# Patient Record
Sex: Male | Born: 1935 | Race: White | Hispanic: No | Marital: Married
Health system: Southern US, Community
[De-identification: ages and names within clinical notes are randomized; demographics above are authoritative.]

## PROBLEM LIST (undated history)

## (undated) DIAGNOSIS — R200 Anesthesia of skin: Secondary | ICD-10-CM

## (undated) DIAGNOSIS — R202 Paresthesia of skin: Secondary | ICD-10-CM

## (undated) DIAGNOSIS — I10 Essential (primary) hypertension: Secondary | ICD-10-CM

## (undated) DIAGNOSIS — E119 Type 2 diabetes mellitus without complications: Secondary | ICD-10-CM

## (undated) DIAGNOSIS — H9190 Unspecified hearing loss, unspecified ear: Secondary | ICD-10-CM

## (undated) DIAGNOSIS — C4491 Basal cell carcinoma of skin, unspecified: Secondary | ICD-10-CM

## (undated) DIAGNOSIS — R350 Frequency of micturition: Secondary | ICD-10-CM

## (undated) HISTORY — PX: SKIN CANCER EXCISION: SHX779

## (undated) HISTORY — PX: CHOLECYSTECTOMY: SHX55

## (undated) HISTORY — DX: Unspecified hearing loss, unspecified ear: H91.90

## (undated) HISTORY — DX: Anesthesia of skin: R20.0

## (undated) HISTORY — DX: Essential (primary) hypertension: I10

## (undated) HISTORY — DX: Frequency of micturition: R35.0

## (undated) HISTORY — DX: Type 2 diabetes mellitus without complications: E11.9

## (undated) HISTORY — DX: Paresthesia of skin: R20.2

## (undated) HISTORY — PX: SURGERY SCROTAL / TESTICULAR: SUR1316

## (undated) HISTORY — DX: Basal cell carcinoma of skin, unspecified: C44.91

---

## 2012-04-30 ENCOUNTER — Inpatient Hospital Stay: Payer: Self-pay | Admitting: Family Medicine

## 2012-04-30 DIAGNOSIS — I6789 Other cerebrovascular disease: Secondary | ICD-10-CM

## 2012-04-30 LAB — COMPREHENSIVE METABOLIC PANEL
Anion Gap: 6 — ABNORMAL LOW (ref 7–16)
Bilirubin,Total: 0.5 mg/dL (ref 0.2–1.0)
Chloride: 106 mmol/L (ref 98–107)
EGFR (Non-African Amer.): >60
Osmolality: 286 (ref 275–301)
SGPT (ALT): 26 U/L (ref 12–78)

## 2012-04-30 LAB — URINALYSIS, COMPLETE
Bacteria: NONE SEEN
Blood: NEGATIVE
Ketone: NEGATIVE
Nitrite: NEGATIVE
Ph: 6 (ref 4.5–8.0)
RBC,UR: 1 /HPF (ref 0–5)
WBC UR: <1 /HPF (ref 0–5)

## 2012-04-30 LAB — PROTIME-INR: Prothrombin Time: 13 secs (ref 11.5–14.7)

## 2012-04-30 LAB — CBC
HCT: 42.2 % (ref 40.0–52.0)
HGB: 14.2 g/dL (ref 13.0–18.0)
MCHC: 33.6 g/dL (ref 32.0–36.0)
Platelet: 251 10*3/uL (ref 150–440)
RBC: 4.57 10*6/uL (ref 4.40–5.90)
RDW: 12.2 % (ref 11.5–14.5)
WBC: 9 10*3/uL (ref 3.8–10.6)

## 2012-04-30 LAB — TSH: Thyroid Stimulating Horm: 2.76 u[IU]/mL

## 2012-04-30 LAB — TROPONIN I: Troponin-I: <0.02 ng/mL

## 2012-05-01 LAB — CBC WITH DIFFERENTIAL/PLATELET
Basophil %: 0.6 %
HCT: 40.4 % (ref 40.0–52.0)
HGB: 14 g/dL (ref 13.0–18.0)
Lymphocyte #: 2.8 10*3/uL (ref 1.0–3.6)
Neutrophil #: 5.3 10*3/uL (ref 1.4–6.5)
Neutrophil %: 58.3 %
Platelet: 259 10*3/uL (ref 150–440)
RBC: 4.38 10*6/uL — ABNORMAL LOW (ref 4.40–5.90)
WBC: 9.1 10*3/uL (ref 3.8–10.6)

## 2012-05-01 LAB — BASIC METABOLIC PANEL
BUN: 12 mg/dL (ref 7–18)
Calcium, Total: 8.8 mg/dL (ref 8.5–10.1)
Co2: 29 mmol/L (ref 21–32)
Creatinine: 0.8 mg/dL (ref 0.60–1.30)
Glucose: 178 mg/dL — ABNORMAL HIGH (ref 65–99)
Osmolality: 285 (ref 275–301)

## 2012-05-01 LAB — LIPID PANEL
Cholesterol: 139 mg/dL (ref 0–200)
HDL Cholesterol: 32 mg/dL — ABNORMAL LOW (ref 40–60)
Triglycerides: 270 mg/dL — ABNORMAL HIGH (ref 0–200)

## 2012-05-02 ENCOUNTER — Other Ambulatory Visit: Payer: Self-pay | Admitting: Physical Medicine and Rehabilitation

## 2012-05-02 ENCOUNTER — Encounter: Payer: Self-pay | Admitting: *Deleted

## 2012-05-02 ENCOUNTER — Encounter: Payer: Self-pay | Admitting: Physical Medicine and Rehabilitation

## 2012-05-02 ENCOUNTER — Inpatient Hospital Stay (HOSPITAL_COMMUNITY)
Admission: RE | Admit: 2012-05-02 | Discharge: 2012-05-15 | DRG: 945 | Disposition: A | Discharge status: Home / Self Care | Payer: Medicare Other | Source: Intra-hospital | Attending: Physical Medicine & Rehabilitation | Admitting: Physical Medicine & Rehabilitation

## 2012-05-02 DIAGNOSIS — G319 Degenerative disease of nervous system, unspecified: Secondary | ICD-10-CM

## 2012-05-02 DIAGNOSIS — R1310 Dysphagia, unspecified: Secondary | ICD-10-CM

## 2012-05-02 DIAGNOSIS — R29810 Facial weakness: Secondary | ICD-10-CM

## 2012-05-02 DIAGNOSIS — Z5189 Encounter for other specified aftercare: Principal | ICD-10-CM

## 2012-05-02 DIAGNOSIS — I10 Essential (primary) hypertension: Secondary | ICD-10-CM

## 2012-05-02 DIAGNOSIS — M19049 Primary osteoarthritis, unspecified hand: Secondary | ICD-10-CM

## 2012-05-02 DIAGNOSIS — I639 Cerebral infarction, unspecified: Secondary | ICD-10-CM | POA: Diagnosis present

## 2012-05-02 DIAGNOSIS — H918X9 Other specified hearing loss, unspecified ear: Secondary | ICD-10-CM

## 2012-05-02 DIAGNOSIS — G5600 Carpal tunnel syndrome, unspecified upper limb: Secondary | ICD-10-CM

## 2012-05-02 DIAGNOSIS — G8190 Hemiplegia, unspecified affecting unspecified side: Secondary | ICD-10-CM

## 2012-05-02 DIAGNOSIS — R4789 Other speech disturbances: Secondary | ICD-10-CM

## 2012-05-02 DIAGNOSIS — E119 Type 2 diabetes mellitus without complications: Secondary | ICD-10-CM

## 2012-05-02 DIAGNOSIS — H9190 Unspecified hearing loss, unspecified ear: Secondary | ICD-10-CM | POA: Insufficient documentation

## 2012-05-02 DIAGNOSIS — I6330 Cerebral infarction due to thrombosis of unspecified cerebral artery: Secondary | ICD-10-CM

## 2012-05-02 DIAGNOSIS — G8110 Spastic hemiplegia affecting unspecified side: Secondary | ICD-10-CM

## 2012-05-02 DIAGNOSIS — Z85820 Personal history of malignant melanoma of skin: Secondary | ICD-10-CM

## 2012-05-02 DIAGNOSIS — I6350 Cerebral infarction due to unspecified occlusion or stenosis of unspecified cerebral artery: Secondary | ICD-10-CM

## 2012-05-02 DIAGNOSIS — I69921 Dysphasia following unspecified cerebrovascular disease: Secondary | ICD-10-CM

## 2012-05-02 LAB — BASIC METABOLIC PANEL
Anion Gap: 6 — ABNORMAL LOW (ref 7–16)
BUN: 15 mg/dL (ref 7–18)
Calcium, Total: 8.5 mg/dL (ref 8.5–10.1)
Chloride: 106 mmol/L (ref 98–107)
Co2: 28 mmol/L (ref 21–32)
Creatinine: 0.91 mg/dL (ref 0.60–1.30)
Glucose: 173 mg/dL — ABNORMAL HIGH (ref 65–99)
Osmolality: 284 (ref 275–301)

## 2012-05-02 LAB — URINALYSIS, ROUTINE W REFLEX MICROSCOPIC
Bilirubin Urine: NEGATIVE
Glucose, UA: NEGATIVE mg/dL
Hgb urine dipstick: NEGATIVE
Ketones, ur: 15 mg/dL — AB
Leukocytes, UA: NEGATIVE
Protein, ur: NEGATIVE mg/dL

## 2012-05-02 LAB — GLUCOSE, CAPILLARY
Glucose-Capillary: 155 mg/dL — ABNORMAL HIGH (ref 70–99)
Glucose-Capillary: 179 mg/dL — ABNORMAL HIGH (ref 70–99)

## 2012-05-02 LAB — MAGNESIUM: Magnesium: 1.6 mg/dL — ABNORMAL LOW

## 2012-05-02 MED ORDER — INSULIN ASPART 100 UNIT/ML ~~LOC~~ SOLN
0.0000 [IU] | Freq: Every day | SUBCUTANEOUS | Status: DC
  Administered 2012-05-03 – 2012-05-04 (×2): 2 [IU] via SUBCUTANEOUS
  Administered 2012-05-05: 22:00:00 via SUBCUTANEOUS
  Administered 2012-05-07: 2 [IU] via SUBCUTANEOUS

## 2012-05-02 MED ORDER — ALUM & MAG HYDROXIDE-SIMETH 200-200-20 MG/5ML PO SUSP: 30.0000 mL | ORAL | Status: DC | PRN

## 2012-05-02 MED ORDER — METHOCARBAMOL 500 MG PO TABS
500.0000 mg | ORAL_TABLET | Freq: Four times a day (QID) | ORAL | Status: DC | PRN
  Administered 2012-05-02: 500 mg via ORAL
  Filled 2012-05-02: qty 1

## 2012-05-02 MED ORDER — METFORMIN HCL 500 MG PO TABS
500.0000 mg | ORAL_TABLET | Freq: Two times a day (BID) | ORAL | Status: DC
  Administered 2012-05-02 – 2012-05-08 (×12): 500 mg via ORAL
  Filled 2012-05-02 (×14): qty 1

## 2012-05-02 MED ORDER — INSULIN GLARGINE 100 UNIT/ML ~~LOC~~ SOLN
10.0000 [IU] | Freq: Every day | SUBCUTANEOUS | Status: DC
  Filled 2012-05-02: qty 0.1

## 2012-05-02 MED ORDER — TRAZODONE HCL 50 MG PO TABS: 25.0000 mg | ORAL_TABLET | Freq: Every evening | ORAL | Status: DC | PRN

## 2012-05-02 MED ORDER — ASPIRIN EC 325 MG PO TBEC
325.0000 mg | DELAYED_RELEASE_TABLET | Freq: Every day | ORAL | Status: DC
  Administered 2012-05-03 – 2012-05-15 (×13): 325 mg via ORAL
  Filled 2012-05-02 (×14): qty 1

## 2012-05-02 MED ORDER — ACETAMINOPHEN 325 MG PO TABS
325.0000 mg | ORAL_TABLET | ORAL | Status: DC | PRN
  Administered 2012-05-06: 650 mg via ORAL
  Filled 2012-05-02: qty 2

## 2012-05-02 MED ORDER — CALCIUM POLYCARBOPHIL 625 MG PO TABS
625.0000 mg | ORAL_TABLET | Freq: Every day | ORAL | Status: DC
  Administered 2012-05-03 – 2012-05-15 (×11): 625 mg via ORAL
  Filled 2012-05-02 (×14): qty 1

## 2012-05-02 MED ORDER — INSULIN ASPART 100 UNIT/ML ~~LOC~~ SOLN
0.0000 [IU] | Freq: Three times a day (TID) | SUBCUTANEOUS | Status: DC
  Administered 2012-05-02: 3 [IU] via SUBCUTANEOUS
  Administered 2012-05-03: 8 [IU] via SUBCUTANEOUS
  Administered 2012-05-03 (×2): 2 [IU] via SUBCUTANEOUS
  Administered 2012-05-04: 8 [IU] via SUBCUTANEOUS
  Administered 2012-05-04: 5 [IU] via SUBCUTANEOUS
  Administered 2012-05-04 – 2012-05-05 (×3): 3 [IU] via SUBCUTANEOUS
  Administered 2012-05-05 – 2012-05-06 (×2): 5 [IU] via SUBCUTANEOUS
  Administered 2012-05-06 – 2012-05-07 (×3): 3 [IU] via SUBCUTANEOUS
  Administered 2012-05-07: 5 [IU] via SUBCUTANEOUS
  Administered 2012-05-07 – 2012-05-08 (×3): 3 [IU] via SUBCUTANEOUS
  Administered 2012-05-08: 8 [IU] via SUBCUTANEOUS
  Administered 2012-05-09: 3 [IU] via SUBCUTANEOUS
  Administered 2012-05-10: 2 [IU] via SUBCUTANEOUS

## 2012-05-02 MED ORDER — ENOXAPARIN SODIUM 40 MG/0.4ML ~~LOC~~ SOLN
40.0000 mg | SUBCUTANEOUS | Status: DC
  Administered 2012-05-02 – 2012-05-14 (×13): 40 mg via SUBCUTANEOUS
  Filled 2012-05-02 (×14): qty 0.4

## 2012-05-02 MED ORDER — BISACODYL 5 MG PO TBEC
5.0000 mg | DELAYED_RELEASE_TABLET | Freq: Every day | ORAL | Status: DC | PRN
  Administered 2012-05-13: 5 mg via ORAL
  Filled 2012-05-02: qty 1

## 2012-05-02 MED ORDER — DIPHENHYDRAMINE HCL 12.5 MG/5ML PO ELIX
12.5000 mg | ORAL_SOLUTION | Freq: Four times a day (QID) | ORAL | Status: DC | PRN
  Filled 2012-05-02: qty 10

## 2012-05-02 MED ORDER — NIACIN ER 500 MG PO CPCR
500.0000 mg | ORAL_CAPSULE | Freq: Every day | ORAL | Status: DC
  Administered 2012-05-02 – 2012-05-14 (×13): 500 mg via ORAL
  Filled 2012-05-02 (×14): qty 1

## 2012-05-02 MED ORDER — INSULIN GLARGINE 100 UNIT/ML ~~LOC~~ SOLN
30.0000 [IU] | Freq: Two times a day (BID) | SUBCUTANEOUS | Status: DC
  Administered 2012-05-02: 30 [IU] via SUBCUTANEOUS
  Filled 2012-05-02 (×4): qty 0.3

## 2012-05-02 MED ORDER — RANITIDINE HCL 150 MG/10ML PO SYRP
150.0000 mg | ORAL_SOLUTION | Freq: Two times a day (BID) | ORAL | Status: DC
  Administered 2012-05-02 – 2012-05-04 (×4): 150 mg via ORAL
  Filled 2012-05-02 (×6): qty 10

## 2012-05-02 MED ORDER — GUAIFENESIN-DM 100-10 MG/5ML PO SYRP: 5.0000 mL | ORAL_SOLUTION | Freq: Four times a day (QID) | ORAL | Status: DC | PRN

## 2012-05-02 MED ORDER — ATORVASTATIN CALCIUM 20 MG PO TABS
20.0000 mg | ORAL_TABLET | Freq: Every day | ORAL | Status: DC
  Administered 2012-05-02 – 2012-05-14 (×13): 20 mg via ORAL
  Filled 2012-05-02 (×14): qty 1

## 2012-05-02 MED ORDER — SENNOSIDES-DOCUSATE SODIUM 8.6-50 MG PO TABS: 1.0000 | ORAL_TABLET | Freq: Every evening | ORAL | Status: DC | PRN

## 2012-05-02 NOTE — Progress Notes (Signed)
Patient information reviewed and entered into eRehab system by Lavonn Maxcy, RN, CRRN, PPS Coordinator.  Information including medical coding and functional independence measure will be reviewed and updated through discharge.     Per nursing patient was given "Data Collection Information Summary for Patients in Inpatient Rehabilitation Facilities with attached "Privacy Act Statement-Health Care Records" upon admission.  

## 2012-05-02 NOTE — Progress Notes (Signed)
Pt arrived via EMS from [place] to room 4038, pt alert and orienetd, wife present wit pt, pt oriented to rehab schedule, room, safety plan and call bell system. Pt resting in bed with call bell in reach, pt denies any concerns

## 2012-05-02 NOTE — Plan of Care (Addendum)
Overall Plan of Care [facility]) Patient Details Name: Larry Williams MRN: [id number] DOB: 1935/03/27  Diagnosis:  Left pontine infarct  Co-morbidities: Hypertension  .  Skin cancer, basal cell  .  Hearing loss  .  Numbness and tingling in right hand  .  Frequent urination  .  Diabetes mellitus without complication    Functional Problem List  Patient demonstrates impairments in the following areas: Balance, Bladder, Bowel, Endurance, Medication Management, Motor, Safety, Sensory  and Skin Integrity  Basic ADL's: eating, grooming, bathing, dressing and toileting Advanced ADL's: simple meal preparation  Transfers:  bed mobility, bed to chair, toilet, tub/shower, car, furniture and floor Locomotion:  ambulation, wheelchair mobility and stairs  Additional Impairments:  Swallowing and Communication  expression  Anticipated Outcomes Item Anticipated Outcome  Eating/Swallowing  Mod I   Basic self-care  Mod I  Tolieting  Mod I  Bowel/Bladder  Pt is continent of bowel and bladder with episodes of bowel incontinence  Transfers  Mod I   Locomotion  Supervision with RW   Communication  Mod I   Cognition  At baseline  Pain  Pt c/o back discomfort from being in bed  Safety/Judgment  Pt will  Call for assist with min assist  Other     Therapy Plan: PT Intensity: Minimum of 1-2 x/day ,45 to 90 minutes PT Frequency: 5 out of 7 days PT Duration Estimated Length of Stay: 10-14days OT Intensity: Minimum of 1-2 x/day, 45 to 90 minutes OT Frequency: 5 out of 7 days OT Duration/Estimated Length of Stay: 10-14 days SLP Intensity: Minumum of 1-2 x/day, 30 to 90 minutes SLP Frequency: 5 out of 7 days SLP Duration/Estimated Length of Stay: 1-2 weeks    Team Interventions: Item RN PT OT SLP SW TR Other  Self Care/Advanced ADL Retraining  x x      Neuromuscular Re-Education  x x      Therapeutic Activities  x x x     UE/LE Strength Training/ROM  x x      UE/LE Coordination  Activities  x x      Visual/Perceptual Remediation/Compensation         DME/Adaptive Equipment Instruction  x x      Therapeutic Exercise  x x      Balance/Vestibular Training  x x      Patient/Family Education  x x x     Cognitive Remediation/Compensation         Functional Mobility Training  x x      Ambulation/Gait Training  x       Museum/gallery curator  x       Wheelchair Propulsion/Positioning  x       Functional Tourist information centre manager Reintegration  x x      Dysphagia/Aspiration Printmaker    x     Speech/Language Facilitation    x     Bladder Management x        Bowel Management x        Disease Management/Prevention  x       Pain Management x x x      Medication Management x        Skin Care/Wound Management         Splinting/Orthotics         Discharge Planning  x x x     Psychosocial Support x x x  Team Discharge Planning: Destination: PT- Home, OT- Home , SLP-Home Projected Follow-up: PT-Home health PT;24 hour supervision/assistance Home vs Outpatient PT, OT-  Outpatient OT, SLP- (TBD) Projected Equipment Needs: PT-Rolling walker with 5" wheels;Cane, OT- Tub/shower seat (TBD), SLP-None recommended by SLP Patient/family involved in discharge planning: PT-  Patient,  OT-Patient, SLP-Patient  MD ELOS: 2 weeks Medical Rehab Prognosis:  Excellent Assessment: 77 year old male poorly compliant diabetic admitted for right-sided weakness. Workup revealed left pontine infarct and is now requiring 24 7 rehabilitation RN and M.D. As well as CIR level PT OT and speech therapy. Treatment team will focus on ADLs mobility speech intelligibility and spasticity. Goals for treatment, modified independent ADLs and mobility except supervision for higher level such as steps.    See Team Conference Notes for weekly updates to the plan of care

## 2012-05-02 NOTE — H&P (Signed)
CC: Slurred speech, RUE weakness X one month  HPI: Mr. Larry Williams is a 77 year old LH-male with history of poorly controlled DM, HTN, right hand weakness X 1 month; who was admitted to [facility] on 04/30/12 with slurred speech and right facial weakness. 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 Carotid dopplers done without ICA stenosis. 2D echo with EF 55-60% without wall abnormality. He was evaluated by Dr. Dwan Bolt (neuro) who recommended ASA as patient non complaint with this prior to admission. BSS done by ST and D3, nectar liquids recommended with strict aspiration precautions. Therapy evaluations done and patient limited by right sided weakness with decreased coordination and poor safety with walker.   Patient was planning to return to [place] this week  Review of Systems  Constitutional:       RUE x 1 mo  HENT: Positive for hearing loss.   Musculoskeletal: Positive for falls.       Fell at work several mo ago  Neurological: Positive for dizziness, speech change, focal weakness and weakness.       Couldn't maintain balance with eyes closed for 1 year  All other systems reviewed and are negative.    Past Medical History   Diagnosis  Date   .  Hypertension    .  Skin cancer, basal cell    .  Hearing loss    .  Numbness and tingling in right hand    .  Frequent urination    .  Diabetes mellitus without complication      poorly controlled-BS occasionally 300-400 range.    Past Surgical History   Procedure  Laterality  Date   .  Cholecystectomy     .  Surgery scrotal / testicular       for undescended testicle at age 56   .  Skin cancer excision      Family History   Problem  Relation  Age of Onset   .  Cancer - Lung  Father     Social History: Married. 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 has no tobacco, alcohol, and drug  history on file.  Allergies: Allergies no known allergies  Home:   Functional History:  Independent without AD.  Functional Status:  Mobility:  Contact guard assist for transfer with impaired balance.  Min assist ambulating 69' with RW. Needs cues for safe walker use.   ADL:   Cognition:    Physical Exam:  General: No acute distress HEENT: No evidence of facial droop. Nares clear, tongue midline, no oral lesions Heart: Regular rate and rhythm no rubs murmurs or extra sounds Lungs clear to auscultation Abdomen: Positive bowel sounds soft nontender palpation Extremities: No clubbing cyanosis or edema Skin: No evidence of skin breakdown on pressure areas Neuro: Cranial nerves II through XII are intact Speech is dysarthric No evidence of aphasia No evidence of apraxia Mild right upper extremity ataxia finger nose to finger testing Motor strength is 4 minus at the right deltoid, biceps, triceps, grip Right hand finger to thumb opposition is slowed with decreased fine motor movements Right lower extremity for/5 in the hip flexor knee extensor 3 minus/5 in the right ankle dorsiflexor and plantar flexor Sensation intact to light touch and proprioception in both upper and lower limbs Left-sided strength is normal  No results found for this or any previous visit (from  the past 48 hour(s)).  No results found.  Post Admission Physician Evaluation:  1. Functional deficits secondary to Left pontine infarct with Right hemiparesis and dysphagia. 2. Patient is admitted to receive collaborative, interdisciplinary care between the physiatrist, rehab nursing staff, and therapy team. 3. Patient's level of medical complexity and substantial therapy needs in context of that medical necessity cannot be provided at a lesser intensity of care such as a SNF. 4. Patient has experienced substantial functional loss from his/her baseline which was documented above under the "Functional History" and  "Functional Status" headings. Judging by the patient's diagnosis, physical exam, and functional history, the patient has potential for functional progress which will result in measurable gains while on inpatient rehab. These gains will be of substantial and practical use upon discharge in facilitating mobility and self-care at the household level. 5. Physiatrist will provide 24 hour management of medical needs as well as oversight of the therapy plan/treatment and provide guidance as appropriate regarding the interaction of the two. 6. 24 hour rehab nursing will assist with bladder management, bowel management, safety, skin/wound care, disease management, medication administration and patient education and help integrate therapy concepts, techniques,education, etc. 7. PT will assess and treat for/with: Pre-gait training, gait training, neuromuscular reeducation, safety, endurance, equipment. Goals are: Modified independent level with all mobility. 8. OT will assess and treat for/with: ADLs, cognitive perceptual skills, neuromuscular reeducation, fine motor tasks, safety, equipment. Goals are: Modified independent level with ADLs. 9. SLP will assess and treat for/with: Swallowing, dysarthria. Goals are: 100% speech intelligibility, safe by mouth intake of all consistencies. 10. Case Management and Social Worker will assess and treat for psychological issues and discharge planning. 11. Team conference will be held weekly to assess progress toward goals and to determine barriers to discharge. 12. Patient will receive at least 3 hours of therapy per day at least 5 days per week. 13. ELOS: 10-12 days Prognosis: excellent Medical Problem List and Plan:  1. DVT Prophylaxis/Anticoagulation: Pharmaceutical: Lovenox  2. Pain Management: Tylenol as needed  3. Mood: No evidence of depression or emotional lability. Will monitor.  4. Neuropsych: This patient Is capable of making decisions on his/her own behalf.  5.  Diabetes continue Lantus as well as sliding scale will adjust as needed. 6. Hypertension by history currently not on medications will monitor. 05/02/2012

## 2012-05-02 NOTE — PMR Pre-admission (Signed)
[facility] PMR Admission Coordinator Pre-Admission Assessment  Patient: Larry Williams is an 77 y.o., male MRN: [id number] DOB: 1935-09-08 Height: 5\' 8"  (172.7 cm) Weight: 99.791 kg (220 lb)  Insurance Information  PRIMARY:  Primary: Medicare Policy#:[id number] A Subscriber: Patient Benefits: [employer] Effective Date: 06/18/02 Deductible: $1126 OOP Max: None Life Max: Unlimited CIR: 100% SNF: 100 days LBD:  Outpatient: 80% Copay: 20%  Home Health: 100% DME: 80%  Copay: 20% Providers: Patient's choice  SECONDARY: [employer]     Policy#: [id number]     Subscriber: self   Emergency Contact Information Contact Information   Hollister Wessler   [phone]      Current Medical History  Patient Admitting Diagnosis: Acute pontine ischemia  History of Present Illness: 77 yo male with history of uncontrolled diabetes and hypertension. He woke up on 04/30/12 with slurred speech and difficulty walking. CT indicated no acute intracranial findings. MRI shows acute pontine acute ischemia and severe atrophy chronic white matter ischemic change. Patient is alert and oriented x3 and persists with right sided weakness, dysarthria of speech and dysphagia. NIH Stroke scale:  Past Medical History  Uncontrolled type 2 insulin dependent diabetes Hypertension History of skin cancers/ basal cell carcinoma Hearing loss  Family History  family history is not on file.  Prior Rehab/Hospitalizations: none   Current Medications See MAR  Patients Current Diet:  Dysphagia 3, nectar thick liquids  Precautions / Restrictions Precautions Precautions/Special Needs: Swallowing   Prior Activity Level Community (5-7x/wk): Active daily Home Assistive Devices / Equipment     Prior Functional Level Current Functional Level  Bed Mobility  Independent  Min assist   Transfers  Independent  Min assist   Mobility - Walk/Wheelchair  Independent  Min assist   Upper Body Dressing  Independent  Min assist   Lower Body Dressing  Independent  Min assist   Grooming  Independent  Min assist   Eating/Drinking  Independent  Mod Independent   Toilet Transfer  Independent  Min assist   Bladder Continence   Independent  Continent, uses urinal   Bowel Management  Independent  Incontinent x1 due to urgency   Stair Climbing  Able to Take Stairs?: Yes      Communication  WNL  WNL   Memory  WNL  WNL   Cooking/Meal Prep  Independent      Housework  Independent    Money Management  independent    Driving  Yes     Previous Home Environment Living Arrangements: Spouse/significant other Lives With: Spouse Available Help at Discharge: Family Type of Home: Mobile home Home Layout: One level Home Access: Stairs to enter Entrance Stairs-Rails: Can reach both Entrance Stairs-Number of Steps: 3-4 Bathroom Shower/Tub: Health visitor: Pharmacist, community: No Home Care Services: No  Discharge Living Setting Plans for Discharge Living Setting: Patient's home;Mobile Home Type of Home at Discharge: Mobile home Discharge Home Layout: One level Discharge Home Access: Stairs to enter Entrance Stairs-Rails: Can reach both Entrance Stairs-Number of Steps: 3-4 Discharge Bathroom Shower/Tub: Walk-in shower Discharge Bathroom Toilet: Standard Discharge Bathroom Accessibility: No Do you have any problems obtaining your medications?: No  Social/Family/Support Systems Patient Roles: Spouse;Volunteer Programmer, 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[employer].) Contact Information: [phone] Anticipated Caregiver: Wife Anticipated Caregiver's Contact Information: Geno Sydnor: [phone] Ability/Limitations of Caregiver: min assist Caregiver Availability: 24/7 Discharge Plan Discussed with Primary Caregiver: Yes Is Caregiver In Agreement with Plan?: Yes Does Caregiver/Family have Issues with Lodging/Transportation while Pt is in Rehab?:  No  Goals/Additional  Needs Patient/Family Goal for Rehab: modified independent overall Expected length of stay: 1 week Cultural Considerations: none Dietary Needs: Dysphagia  3 with nectar-thick liquids Equipment Needs: TBD Additional Information: 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 They are from [place] and live in a camper-trailer there. The trailor is not accessible, difficult to enter. They plan to stay in [facility] until patient is ready to return to [place]. They have family in [place]. Pt/Family Agrees to Admission and willing to participate: Yes Program Orientation Provided & Reviewed with Pt/Caregiver Including Roles  & Responsibilities: Yes  Patient Condition: This patient was evaluated by the CIR Admission Coordinator on 05/01/12 and was determined to be appropriate for Acute Inpatient Rehabilitation. The patient's case has been discussed with the Rehabilitation Physician who has determined and documented that the patient's condition is appropriate for intensive rehabilitative care in an inpatient rehabilitation facility. Will admit to inpatient rehab today.  Preadmission Screen Completed By:  Meryl Dare, 05/02/2012 10:33 AM ______________________________________________________________________   Discussed status with Dr. Wynn Banker on 05/02/12 at 0815 and received telephone approval for admission today.  Admission Coordinator:  Meryl Dare, time 1115/Date 05/02/12   Assessment/Plan: Diagnosis: 1. Does the need for close, 24 hr/day  Medical supervision in concert with the patient's rehab needs make it unreasonable for this patient to be served in a less intensive setting? Yes 2. Co-Morbidities requiring supervision/potential complications: Diabetes, HTN 3. Due to bladder management, bowel management, safety, skin/wound care, disease management, medication administration and patient education, does the  patient require 24 hr/day rehab nursing? Yes 4. Does the patient require coordinated care of a physician, rehab nurse, PT (1-2 hrs/day, 5 days/week), OT (1-2 hrs/day, 5 days/week) and SLP (.5-1 hrs/day, 5 days/week) to address physical and functional deficits in the context of the above medical diagnosis(es)? Yes Addressing deficits in the following areas: balance, endurance, locomotion, strength, transferring, bowel/bladder control, bathing, dressing, feeding, grooming, toileting and swallowing 5. Can the patient actively participate in an intensive therapy program of at least 3 hrs of therapy 5 days a week? Yes 6. The potential for patient to make measurable gains while on inpatient rehab is good 7. Anticipated functional outcomes upon discharge from inpatients are Sup/Mod I mob PT, Sup/Mod I ADL OT, Safe po intake nl consistencySLP 8. Estimated rehab length of stay to reach the above functional goals is: 7-10 d 9. Does the patient have adequate social supports to accommodate these discharge functional goals? Yes 10. Anticipated D/C setting: Home 11. Anticipated post D/C treatments: Outpt therapy 12. Overall Rehab/Functional Prognosis: good    RECOMMENDATIONS: This patient's condition is appropriate for continued rehabilitative care in the following setting: CIR Patient has agreed to participate in recommended program. Potentially Note that insurance prior authorization may be required for reimbursement for recommended care.  Comment:  Meryl Dare 05/02/2012

## 2012-05-03 ENCOUNTER — Inpatient Hospital Stay (HOSPITAL_COMMUNITY): Payer: Medicare Other | Admitting: Speech Pathology

## 2012-05-03 ENCOUNTER — Inpatient Hospital Stay (HOSPITAL_COMMUNITY): Payer: Medicare Other | Admitting: *Deleted

## 2012-05-03 ENCOUNTER — Inpatient Hospital Stay (HOSPITAL_COMMUNITY): Payer: Medicare Other | Admitting: Occupational Therapy

## 2012-05-03 DIAGNOSIS — I69921 Dysphasia following unspecified cerebrovascular disease: Secondary | ICD-10-CM

## 2012-05-03 DIAGNOSIS — G8110 Spastic hemiplegia affecting unspecified side: Secondary | ICD-10-CM

## 2012-05-03 DIAGNOSIS — I6330 Cerebral infarction due to thrombosis of unspecified cerebral artery: Secondary | ICD-10-CM

## 2012-05-03 LAB — CBC WITH DIFFERENTIAL/PLATELET
Basophils Absolute: 0 10*3/uL (ref 0.0–0.1)
Basophils Relative: 0 % (ref 0–1)
Lymphocytes Relative: 19 % (ref 12–46)
MCHC: 35.7 g/dL (ref 30.0–36.0)
Neutro Abs: 9.1 10*3/uL — ABNORMAL HIGH (ref 1.7–7.7)
Neutrophils Relative %: 74 % (ref 43–77)
Platelets: 250 10*3/uL (ref 150–400)
RDW: 12 % (ref 11.5–15.5)
WBC: 12.3 10*3/uL — ABNORMAL HIGH (ref 4.0–10.5)

## 2012-05-03 LAB — COMPREHENSIVE METABOLIC PANEL
ALT: 17 U/L (ref 0–53)
AST: 20 U/L (ref 0–37)
Albumin: 3.5 g/dL (ref 3.5–5.2)
CO2: 29 mEq/L (ref 19–32)
Chloride: 104 mEq/L (ref 96–112)
GFR calc non Af Amer: 81 mL/min — ABNORMAL LOW (ref >90)
Sodium: 140 mEq/L (ref 135–145)
Total Bilirubin: 0.5 mg/dL (ref 0.3–1.2)

## 2012-05-03 LAB — URINE CULTURE
Colony Count: NO GROWTH
Culture: NO GROWTH

## 2012-05-03 LAB — GLUCOSE, CAPILLARY
Glucose-Capillary: 150 mg/dL — ABNORMAL HIGH (ref 70–99)
Glucose-Capillary: 230 mg/dL — ABNORMAL HIGH (ref 70–99)
Glucose-Capillary: 276 mg/dL — ABNORMAL HIGH (ref 70–99)
Glucose-Capillary: 237 mg/dL — ABNORMAL HIGH (ref 70–99)

## 2012-05-03 MED ORDER — INSULIN GLARGINE 100 UNIT/ML ~~LOC~~ SOLN
35.0000 [IU] | Freq: Two times a day (BID) | SUBCUTANEOUS | Status: DC
  Administered 2012-05-03 – 2012-05-05 (×5): 35 [IU] via SUBCUTANEOUS
  Filled 2012-05-03 (×7): qty 0.35

## 2012-05-03 NOTE — Evaluation (Signed)
Speech Language Pathology Assessment and Plan  Patient Details  Name: Larry Williams MRN: [id number] Date of Birth: [birth year]  SLP Diagnosis: Dysarthria;Dysphagia  Rehab Potential: Good ELOS: 1-2 weeks   Today's Date: 05/03/2012 Time: 1507-1600 Time Calculation (min): 53 min  Problem List:  Patient Active Problem List  Diagnosis  . Left pontine CVA  . HTN (hypertension)  . Diabetes  . Hearing loss   Past Medical History:  Past Medical History  Diagnosis Date  . Hypertension   . Skin cancer, basal cell   . Hearing loss   . Numbness and tingling in right hand   . Frequent urination   . Diabetes mellitus without complication     poorly controlled-BS occasionally 300-400 range.    Past Surgical History:  Past Surgical History  Procedure Laterality Date  . Cholecystectomy    . Surgery scrotal / testicular      for undescended testicle at age 28  . Skin cancer excision      Assessment / Plan / Recommendation Clinical Impression  Mr. Larry Williams is a 77 year old LH-male with history of poorly controlled DM, HTN, right hand weakness X 1 month; who was admitted to [facility] on 04/30/12 with slurred speech and right facial weakness. 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 Carotid dopplers done without ICA stenosis. 2D echo with EF 55-60% without wall abnormality. He was evaluated by Dr. Dwan Bolt (neuro) who recommended ASA as patient non complaint with this prior to admission. BSS done by ST and Dys.3 textures and nectar-thick liquids recommended with strict aspiration precautions. Patient admitted for intensive therapy program 05/02/12.  Dysarthria and Bedside Swallow Evaluations completed today.  Patient exhibits mild dysarthria and mild oropharyngeal dysphagia.  It is recommended that he receive skilled SLP services to address these deficits, maximize functional independence and reduce burden of care prior to discharge home with wife.     SLP initiated skilled treatment by educating patient regarding need to slow pace of self-feeding as well as rate of speech in conversation to compensate for mild weakness as a result of his CVA.      SLP Assessment  Patient will need skilled Speech Lanaguage Pathology Services during CIR admission    Recommendations  Diet Recommendations: Dysphagia 3 (Mechanical Soft);Nectar-thick liquid Liquid Administration via: No straw;Cup Medication Administration: Whole meds with puree Supervision: Patient able to self feed;Full supervision/cueing for compensatory strategies Compensations: Slow rate;Small sips/bites;Check for pocketing Postural Changes and/or Swallow Maneuvers: Out of bed for meals;Seated upright 90 degrees;Upright 30-60 min after meal Oral Care Recommendations: Oral care BID Patient destination: Home Follow up Recommendations:  (TBD) Equipment Recommended: None recommended by SLP    SLP Frequency 5 out of 7 days   SLP Treatment/Interventions Cognitive remediation/compensation;Cueing hierarchy;Dysphagia/aspiration precaution training;Environmental controls;Functional tasks;Internal/external aids;Patient/family education;Therapeutic Exercise;Speech/Language facilitation    Pain Pain Assessment Pain Assessment: No/denies pain Pain Score: 0-No pain  Prior Functioning Cognitive/Linguistic Baseline: Within functional limits  Short Term Goals: Week 1: SLP Short Term Goal 1 (Week 1): Patient will consume Dys.3 textures and nectar-thick liuqids with modified independene for use of compensatory strategies. SLP Short Term Goal 2 (Week 1): Patient will consume trials of thin liquids via cup with no ovet s/s of aspiraiton and modified indepenence to follow water protocol procedures. SLP Short Term Goal 3 (Week 1): Patient will increase speech intelligibility at the conversational level with increased wait time to slow rate of speech.    See FIM for current functional  status Refer to  Care Plan for Long Term Goals  Recommendations for other services: None  Discharge Criteria: Patient will be discharged from SLP if patient refuses treatment 3 consecutive times without medical reason, if treatment goals not met, if there is a change in medical status, if patient makes no progress towards goals or if patient is discharged from hospital.  The above assessment, treatment plan, treatment alternatives and goals were discussed and mutually agreed upon: by patient  Larry Williams, M.A., CCC-SLP [phone]  Larry Williams 05/03/2012, 4:54 PM

## 2012-05-03 NOTE — Evaluation (Signed)
Occupational Therapy Assessment and Plan  Patient Details  Name: Larry Williams MRN: [id number] Date of Birth: [birth year]  OT Diagnosis: hemiplegia affecting non-dominant side and muscle weakness (generalized) Rehab Potential: Rehab Potential: Good ELOS: 10-14 days   Today's Date: 05/03/2012 Time: 5284-1324 Time Calculation (min): 60 min  Problem List:  Patient Active Problem List  Diagnosis  . Left pontine CVA  . HTN (hypertension)  . Diabetes  . Hearing loss    Past Medical History:  Past Medical History  Diagnosis Date  . Hypertension   . Skin cancer, basal cell   . Hearing loss   . Numbness and tingling in right hand   . Frequent urination   . Diabetes mellitus without complication     poorly controlled-BS occasionally 300-400 range.    Past Surgical History:  Past Surgical History  Procedure Laterality Date  . Cholecystectomy    . Surgery scrotal / testicular      for undescended testicle at age 77  . Skin cancer excision      Assessment & Plan Clinical Impression: Patient is a 77 y.o. left handed male with history of poorly controlled DM, HTN, right hand weakness X 1 month; who was admitted to [facility] on 04/30/12 with slurred speech and right facial weakness. 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 Carotid dopplers done without ICA stenosis. 2D echo with EF 55-60% without wall abnormality. He was evaluated by Dr. Dwan Bolt (neuro) who recommended ASA as patient non complaint with this prior to admission. BSS done by ST and D3, nectar liquids recommended with strict aspiration precautions. Therapy evaluations done and patient limited by right sided weakness with decreased coordination and poor safety with walker.   Patient transferred to CIR on 05/02/2012 .    Patient currently requires max with basic self-care skills secondary to muscle weakness, impaired timing and sequencing, abnormal tone, unbalanced muscle activation and  decreased coordination and decreased standing balance, decreased postural control and hemiplegia.  Prior to hospitalization, patient could complete ADLs with independent .  Patient will benefit from skilled intervention to increase independence with basic self-care skills prior to discharge home with care partner.  Anticipate patient will require intermittent supervision and follow up outpatient.  OT - End of Session Activity Tolerance: Tolerates 30+ min activity without fatigue Endurance Deficit: No OT Assessment Rehab Potential: Good Barriers to Discharge: None OT Plan OT Intensity: Minimum of 1-2 x/day, 45 to 90 minutes OT Frequency: 5 out of 7 days OT Duration/Estimated Length of Stay: 10-14 days OT Treatment/Interventions: Metallurgist training;Community reintegration;Discharge planning;Disease mangement/prevention;DME/adaptive equipment instruction;Functional mobility training;Neuromuscular re-education;Pain management;Patient/family education;Psychosocial support;Self Care/advanced ADL retraining;Therapeutic Activities;Skin care/wound managment;Therapeutic Exercise;UE/LE Strength taining/ROM;UE/LE Coordination activities OT Recommendation Patient destination: Home Follow Up Recommendations: Outpatient OT Equipment Recommended: Tub/shower seat (TBD)   Skilled Therapeutic Intervention OT eval completed, ADL assessment completed at EOB with focus on sit <> stand and RUE use in functional tasks.  Pt finishing eating upon arrival, required additional assistance with opening containers secondary to decreased grasp in RUE.  Pt required mod assist sit to stand to pull up pants and mod assist without AD to walk 5 feet to sink to perform grooming tasks.  Pt with decreased RUE control, especially against gravity.    OT Evaluation Precautions/Restrictions  Precautions Precautions: Fall Restrictions Weight Bearing Restrictions: No General   Vital Signs Therapy Vitals Pulse Rate:  68 BP: 144/71 mmHg Patient Position, if appropriate: Sitting Oxygen Therapy SpO2: 98 % O2 Device: None (  Room air) Pain Pain Assessment Pain Assessment: No/denies pain Home Living/Prior Functioning Home Living Lives With: Spouse Available Help at Discharge: Family Type of Home: Mobile home Home Access: Stairs to enter Entrance Stairs-Number of Steps: 4-5 Entrance Stairs-Rails: Right;Left Home Layout: One level Bathroom Shower/Tub: Walk-in shower;Door Bathroom Toilet: Standard Bathroom Accessibility: No Home Adaptive Equipment: None Prior Function Level of Independence: Independent with basic ADLs;Independent with gait;Independent with transfers Able to Take Stairs?: Yes Driving: Yes Vocation: Self employed Charity fundraiser, missionary) Leisure: Hobbies-yes (Comment) ((used to do a lot of drawing)) ADL ADL Eating: Set up Where Assessed-Eating: Bed level Grooming: Setup;Minimal assistance Where Assessed-Grooming: Standing at sink Upper Body Bathing: Minimal assistance Where Assessed-Upper Body Bathing: Edge of bed Lower Body Bathing: Not assessed Upper Body Dressing: Setup;Minimal cueing Where Assessed-Upper Body Dressing: Edge of bed Lower Body Dressing: Maximal assistance Where Assessed-Lower Body Dressing: Edge of bed Toilet Transfer: Moderate assistance Toilet Transfer Method: Ambulating Vision/Perception  Vision - History Baseline Vision: Wears glasses for distance only Vision - Assessment Eye Alignment: Within Functional Limits Perception Perception: Within Functional Limits Praxis Praxis: Intact  Cognition Overall Cognitive Status: Within Functional Limits for tasks assessed Orientation Level: Oriented X4 Sensation Sensation Light Touch: Impaired by gross assessment (Slightly decreased light touch on R) Proprioception: Impaired Detail (50% accurate on R toe) Coordination Gross Motor Movements are Fluid and Coordinated: No Fine Motor Movements are Fluid and  Coordinated: No Coordination and Movement Description: Decreased timing and fluidity on R LE with toe taps and shin slide Finger Nose Finger Test: Decreased timing and fluidity on Rt with slight undershooting, especially when reaching 90 degrees or greater Heel Shin Test: Impaired placement and timing Motor  Motor Motor: Hemiplegia;Motor impersistence;Ataxia;Abnormal postural alignment and control Motor - Skilled Clinical Observations: Impaired posture in standing with decreased R weight shifting, impaired timing and coordination with RLE, decreased sustained activation on R side Mobility  Bed Mobility Bed Mobility: Supine to Sit Supine to Sit: 4: Min assist;HOB flat Supine to Sit Details: Manual facilitation for weight shifting  Trunk/Postural Assessment  Cervical Assessment Cervical Assessment: Within Functional Limits Thoracic Assessment Thoracic Assessment: Within Functional Limits Lumbar Assessment Lumbar Assessment:  (Posterior tilt) Postural Control Postural Control: Deficits on evaluation (Forward flexed posture during gait )  Balance Balance Balance Assessed: Yes Extremity/Trunk Assessment RUE Assessment RUE Assessment: Exceptions to [facility] RUE AROM (degrees) RUE Overall AROM Comments: Pt with limited shoulder flexion, especially against gravity Right Shoulder Flexion: 80 Degrees Right Shoulder ABduction: 80 Degrees RUE Strength RUE Overall Strength Comments: 4- overall, decreased grasp LUE Assessment LUE Assessment: Within Functional Limits  FIM:  FIM - Eating Eating Activity: 5: Supervision/cues;5: Set-up assist for open containers;4: Helper checks for pocketed food FIM - Grooming Grooming Steps: Wash, rinse, dry face;Oral care, brush teeth, clean dentures;Brush, comb hair Grooming: 4: Steadying assist  or patient completes 3 of 4 or 4 of 5 steps FIM - Bathing Bathing Steps Patient Completed: Chest;Right Arm;Left Arm;Abdomen Bathing: 2: Max-Patient completes 3-4  73f 10 parts or 25-49% FIM - Upper Body Dressing/Undressing Upper body dressing/undressing steps patient completed: Thread/unthread right sleeve of pullover shirt/dresss;Thread/unthread left sleeve of pullover shirt/dress;Put head through opening of pull over shirt/dress;Pull shirt over trunk Upper body dressing/undressing: 4: Steadying assist FIM - Lower Body Dressing/Undressing Lower body dressing/undressing steps patient completed: Pull underwear up/down;Thread/unthread left underwear leg;Thread/unthread left pants leg;Pull pants up/down Lower body dressing/undressing: 2: Max-Patient completed 25-49% of tasks FIM - Banker Devices: Bed rails;Arm rests;HOB elevated Bed/Chair Transfer: 4: Supine >  Sit: Min A (steadying Pt. > 75%/lift 1 leg);3: Bed > Chair or W/C: Mod A (lift or lower assist)   Refer to Care Plan for Long Term Goals  Recommendations for other services: None  Discharge Criteria: Patient will be discharged from OT if patient refuses treatment 3 consecutive times without medical reason, if treatment goals not met, if there is a change in medical status, if patient makes no progress towards goals or if patient is discharged from hospital.  The above assessment, treatment plan, treatment alternatives and goals were discussed and mutually agreed upon: by patient  Leonette Monarch 05/03/2012, 12:12 PM

## 2012-05-03 NOTE — Progress Notes (Signed)
Physical Therapy Session Note  Patient Details  Name: Larry Williams MRN: [id number] Date of Birth: May 02, 1935  Today's Date: 05/03/2012 Time: 4098-1191 Time Calculation (min): 27 min  Short Term Goals: Week 1:  PT Short Term Goal 1 (Week 1): Pt will perform bed<>chair transfers with S PT Short Term Goal 2 (Week 1): Pt will ambulate 100' with RW and S PT Short Term Goal 3 (Week 1): Pt will ascend/descend 4 stairs with 1 rail and Min A PT Short Term Goal 4 (Week 1): Pt will demonstrate dynamic standing balance with Min A for 5 min   Skilled Therapeutic Interventions/Progress Updates:  Patient received sitting upright in bed with HOB elevated. This session focused on bed mobility with and without bed rails. Emphasis on rolling R and L and transitioning from R sidelying to sitting and L sidelying to sitting. Patient requires min assist for R sidelying to sit and supervision for L sidelying to sit with mod verbal cues for proper sequencing and technique. For L sidelying to sit, emphasis on bring R UE across chest and bending R knee and pushing through it to assist with rolling to L. Patient reports at home, he will get out of the R side of the bed (when lying in bed).  Patient performed 3 sets x5 reps sit<>stands from edge of bed with min assist with focus on pushing through R UE on his R leg to increase weight bearing. Patient heavily relies on back of his legs to assist with standing up and controlling descent during stand>sit. Patient performed 5 reps of R propped on elbow and educated about benefits of weight bearing activities for increasing strength of R UE.  Patient left supine in bed with HOB elevated and bed alarm on with all needs within reach.  Therapy Documentation Precautions:  Precautions Precautions: Fall Restrictions Weight Bearing Restrictions: No Pain: Pain Assessment Pain Assessment: No/denies pain Pain Score: 0-No pain Faces Pain Scale: No hurt  See FIM for current  functional status  Therapy/Group: Individual Therapy  Chipper Herb. Shanita Kanan, PT, DPT  05/03/2012, 1:32 PM

## 2012-05-03 NOTE — Progress Notes (Signed)
Patient ID: Larry Williams, male   DOB: April 05, 1935, 77 y.o.   MRN: [id number] Subjective/Complaints: 77 year old LH-male with history of poorly controlled DM, HTN, right hand weakness X 1 month; who was admitted to [facility] on 04/30/12 with slurred speech and right facial weakness. 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 Carotid dopplers done without ICA stenosis. 2D echo with EF 55-60% without wall abnormality. He was evaluated by Dr. Dwan Bolt (neuro) who recommended ASA as patient non complaint with this prior to admission. BSS done by ST and D3, nectar liquids recommended with strict aspiration precautions Problems placing urinal Otherwise slept well  Review of Systems  Neurological: Positive for speech change and weakness.  All other systems reviewed and are negative.   Objective: Vital Signs: Blood pressure 155/80, pulse 52, temperature 97.9 F (36.6 C), temperature source Oral, resp. rate 18, weight 95.255 kg (210 lb), SpO2 98.00%. No results found. Results for orders placed during the hospital encounter of 05/02/12 (from the past 72 hour(s))  GLUCOSE, CAPILLARY     Status: Abnormal   Collection Time    05/02/12  4:43 PM      Result Value Range   Glucose-Capillary 155 (*) 70 - 99 mg/dL  MRSA PCR SCREENING     Status: None   Collection Time    05/02/12  5:11 PM      Result Value Range   MRSA by PCR NEGATIVE  NEGATIVE   Comment:            The GeneXpert MRSA Assay (FDA     approved for NASAL specimens     only), is one component of a     comprehensive MRSA colonization     surveillance program. It is not     intended to diagnose MRSA     infection nor to guide or     monitor treatment for     MRSA infections.  URINALYSIS, ROUTINE W REFLEX MICROSCOPIC     Status: Abnormal   Collection Time    05/02/12  8:09 PM      Result Value Range   Color, Urine YELLOW  YELLOW   APPearance CLEAR  CLEAR   Specific Gravity, Urine 1.026  1.005 - 1.030    pH 5.5  5.0 - 8.0   Glucose, UA NEGATIVE  NEGATIVE mg/dL   Hgb urine dipstick NEGATIVE  NEGATIVE   Bilirubin Urine NEGATIVE  NEGATIVE   Ketones, ur 15 (*) NEGATIVE mg/dL   Protein, ur NEGATIVE  NEGATIVE mg/dL   Urobilinogen, UA 0.2  0.0 - 1.0 mg/dL   Nitrite NEGATIVE  NEGATIVE   Leukocytes, UA NEGATIVE  NEGATIVE   Comment: MICROSCOPIC NOT DONE ON URINES WITH NEGATIVE PROTEIN, BLOOD, LEUKOCYTES, NITRITE, OR GLUCOSE <1000 mg/dL.  GLUCOSE, CAPILLARY     Status: Abnormal   Collection Time    05/02/12  8:36 PM      Result Value Range   Glucose-Capillary 179 (*) 70 - 99 mg/dL  CBC WITH DIFFERENTIAL     Status: Abnormal   Collection Time    05/03/12  6:00 AM      Result Value Range   WBC 12.3 (*) 4.0 - 10.5 K/uL   RBC 4.65  4.22 - 5.81 MIL/uL   Hemoglobin 14.8  13.0 - 17.0 g/dL   HCT 78.2  95.6 - 21.3 %   MCV 89.2  78.0 - 100.0 fL   MCH 31.8  26.0 - 34.0 pg  MCHC 35.7  30.0 - 36.0 g/dL   RDW 78.2  95.6 - 21.3 %   Platelets 250  150 - 400 K/uL   Neutrophils Relative 74  43 - 77 %   Neutro Abs 9.1 (*) 1.7 - 7.7 K/uL   Lymphocytes Relative 19  12 - 46 %   Lymphs Abs 2.3  0.7 - 4.0 K/uL   Monocytes Relative 7  3 - 12 %   Monocytes Absolute 0.8  0.1 - 1.0 K/uL   Eosinophils Relative 1  0 - 5 %   Eosinophils Absolute 0.1  0.0 - 0.7 K/uL   Basophils Relative 0  0 - 1 %   Basophils Absolute 0.0  0.0 - 0.1 K/uL  COMPREHENSIVE METABOLIC PANEL     Status: Abnormal   Collection Time    05/03/12  6:00 AM      Result Value Range   Sodium 140  135 - 145 mEq/L   Potassium 4.8  3.5 - 5.1 mEq/L   Chloride 104  96 - 112 mEq/L   CO2 29  19 - 32 mEq/L   Glucose, Bld 144 (*) 70 - 99 mg/dL   BUN 20  6 - 23 mg/dL   Creatinine, Ser 0.86  0.50 - 1.35 mg/dL   Calcium 9.4  8.4 - 57.8 mg/dL   Total Protein 7.3  6.0 - 8.3 g/dL   Albumin 3.5  3.5 - 5.2 g/dL   AST 20  0 - 37 U/L   ALT 17  0 - 53 U/L   Alkaline Phosphatase 78  39 - 117 U/L   Total Bilirubin 0.5  0.3 - 1.2 mg/dL   GFR calc non Af  Amer 81 (*) >90 mL/min   GFR calc Af Amer >90  >90 mL/min   Comment:            The eGFR has been calculated     using the CKD EPI equation.     This calculation has not been     validated in all clinical     situations.     eGFR's persistently     <90 mL/min signify     possible Chronic Kidney Disease.  GLUCOSE, CAPILLARY     Status: Abnormal   Collection Time    05/03/12  7:37 AM      Result Value Range   Glucose-Capillary 150 (*) 70 - 99 mg/dL   Comment 1 Notify RN        General: No acute distress  HEENT: No evidence of facial droop. Nares clear, tongue midline, no oral lesions  Heart: Regular rate and rhythm no rubs murmurs or extra sounds  Lungs clear to auscultation  Abdomen: Positive bowel sounds soft nontender palpation  Extremities: No clubbing cyanosis or edema  Skin: No evidence of skin breakdown on pressure areas  Neuro: Cranial nerves II through XII are intact  Speech is dysarthric  No evidence of aphasia  No evidence of apraxia  Mild right upper extremity ataxia finger nose to finger testing  Motor strength is 4 minus at the right deltoid, biceps, triceps, grip  Right hand finger to thumb opposition is slowed with decreased fine motor movements  Right lower extremity for/5 in the hip flexor knee extensor 3 minus/5 in the right ankle dorsiflexor and plantar flexor  Sensation intact to light touch and proprioception in both upper and lower limbs  Left-sided strength is normal   Assessment/Plan: 1. Functional deficits secondary to Left pontine infarct  which require 3+ hours per day of interdisciplinary therapy in a comprehensive inpatient rehab setting. Physiatrist is providing close team supervision and 24 hour management of active medical problems listed below. Physiatrist and rehab team continue to assess barriers to discharge/monitor patient progress toward functional and medical goals. FIM:                   Comprehension Comprehension Mode:  Auditory Comprehension: 5-Understands complex 90% of the time/Cues < 10% of the time  Expression Expression Mode: Verbal Expression: 5-Expresses complex 90% of the time/cues < 10% of the time  Social Interaction Social Interaction: 7-Interacts appropriately with others - No medications needed.  Problem Solving Problem Solving: 6-Solves complex problems: With extra time  Memory Memory: 7-Complete Independence: No helper  Medical Problem List and Plan:  1. DVT Prophylaxis/Anticoagulation: Pharmaceutical: Lovenox  2. Pain Management: Tylenol as needed  3. Mood: No evidence of depression or emotional lability. Will monitor.  4. Neuropsych: This patient Is capable of making decisions on his/her own behalf.  5. Diabetes continue Lantus as well as sliding scale will adjust as needed. Increase lantus to 35 Units 6. Hypertension by history currently not on medications will monitor   LOS (Days) 1 A FACE TO FACE EVALUATION WAS PERFORMED  Larry Williams E 05/03/2012, 8:16 AM

## 2012-05-03 NOTE — Evaluation (Addendum)
Physical Therapy Assessment and Plan  Patient Details  Name: Larry Williams MRN: [id number] Date of Birth: 1935/07/07  PT Diagnosis: Abnormal posture, Abnormality of gait, Ataxia, Coordination disorder, Hemiparesis non-dominant, Impaired sensation and Muscle weakness Rehab Potential: Good ELOS: 10-14days   Today's Date: 05/03/2012 Time: 4098-1191 Time Calculation (min): 62 min  Problem List:  Patient Active Problem List  Diagnosis  . Left pontine CVA  . HTN (hypertension)  . Diabetes  . Hearing loss    Past Medical History:  Past Medical History  Diagnosis Date  . Hypertension   . Skin cancer, basal cell   . Hearing loss   . Numbness and tingling in right hand   . Frequent urination   . Diabetes mellitus without complication     poorly controlled-BS occasionally 300-400 range.    Past Surgical History:  Past Surgical History  Procedure Laterality Date  . Cholecystectomy    . Surgery scrotal / testicular      for undescended testicle at age 77  . Skin cancer excision      Assessment & Plan Clinical Impression: Mr. Larry Williams is a 77 year old LH-male with history of poorly controlled DM, HTN, right hand weakness X 1 month; who was admitted to [facility] on 04/30/12 with slurred speech and right facial weakness. 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 Carotid dopplers done without ICA stenosis. 2D echo with EF 55-60% without wall abnormality. He was evaluated by Dr. Dwan Bolt (neuro) who recommended ASA as patient non complaint with this prior to admission. BSS done by ST and D3, nectar liquids recommended with strict aspiration precautions. Therapy evaluations done and patient limited by right sided weakness with decreased coordination and poor safety with walker.  Patient transferred to CIR on 05/02/2012 .   Patient currently requires mod with mobility and max with gait secondary to impaired timing and sequencing, unbalanced muscle  activation, ataxia and decreased coordination.  Pt also presents with decreased activity tolerance at eval. Prior to hospitalization, patient was independent  with mobility and lived with Spouse in a Mobile home home.  Home access is 4-5Stairs to enter and able to reach 1 of 2 rails at a time.   Patient will benefit from skilled PT intervention to maximize safe functional mobility, minimize fall risk and decrease caregiver burden for planned discharge home with 24 hour supervision.  Anticipate patient will benefit from follow up HH at discharge.  PT - End of Session Activity Tolerance: Tolerates < 10 min activity, no significant change in vital signs Endurance Deficit: No Endurance Deficit Description: Pt needed supine rest following seated EOB activity PT Assessment Rehab Potential: Good Barriers to Discharge: None PT Plan PT Intensity: Minimum of 1-2 x/day ,45 to 90 minutes PT Frequency: 5 out of 7 days PT Duration Estimated Length of Stay: 10-14days PT Treatment/Interventions: Ambulation/gait training;Balance/vestibular training;Community reintegration;Discharge planning;Disease management/prevention;DME/adaptive equipment instruction;Functional electrical stimulation;Functional mobility training;Neuromuscular re-education;Patient/family education;Psychosocial support;Stair training;Therapeutic Activities;Therapeutic Exercise;UE/LE Strength taining/ROM;UE/LE Coordination activities;Wheelchair propulsion/positioning PT Recommendation Recommendations for Other Services: Neuropsych consult Follow Up Recommendations: Home health PT;24 hour supervision/assistance Patient destination: Home Equipment Recommended: Rolling walker with 5" wheels;Cane Equipment Details: RW vs cane TBD  Skilled Therapeutic Intervention   PT Evaluation Precautions/Restrictions Precautions Precautions: Fall Restrictions Weight Bearing Restrictions: No General Chart Reviewed: Yes Family/Caregiver Present:  No Vital SignsTherapy Vitals Pulse Rate: 68 BP: 144/71 mmHg Patient Position, if appropriate: Sitting Oxygen Therapy SpO2: 98 % O2 Device: None (Room air) Pain Pain Assessment  Pain Assessment: No/denies pain Pain Score: 0-No pain Faces Pain Scale: No hurt Home Living/Prior Functioning Home Living Lives With: Spouse Available Help at Discharge: Family Type of Home: Mobile home Home Access: Stairs to enter Entrance Stairs-Number of Steps: 4-5 Entrance Stairs-Rails: Right;Left Home Layout: One level Bathroom Shower/Tub: Walk-in shower;Door Bathroom Toilet: Standard Bathroom Accessibility: No Home Adaptive Equipment: None Prior Function Level of Independence: Independent with basic ADLs;Independent with gait;Independent with transfers Able to Take Stairs?: Yes Driving: Yes Vocation: Self employed Charity fundraiser, missionary) Leisure: Hobbies-yes (Comment) ((used to do a lot of drawing)) Vision/Perception  Vision - History Baseline Vision: Wears glasses for distance only Vision - Assessment Eye Alignment: Within Functional Limits Perception Perception: Within Functional Limits Praxis Praxis: Intact  Cognition Overall Cognitive Status: Within Functional Limits for tasks assessed Orientation Level: Oriented X4 Sensation Sensation Light Touch: Impaired by gross assessment (Slightly decreased light touch on R) Proprioception: Impaired Detail (50% accurate on R toe) Coordination Gross Motor Movements are Fluid and Coordinated: No Fine Motor Movements are Fluid and Coordinated: No Coordination and Movement Description: Decreased timing and fluidity on R LE with toe taps and shin slide Finger Nose Finger Test: Decreased timing and fluidity on Rt with slight undershooting, especially when reaching 90 degrees or greater Heel Shin Test: Impaired placement and timing Motor  Motor Motor: Hemiplegia;Motor impersistence;Ataxia;Abnormal postural alignment and control Motor - Skilled  Clinical Observations: Impaired posture in standing with decreased R weight shifting, impaired timing and coordination with RLE, decreased sustained activation on R side  Mobility Bed Mobility Bed Mobility: Supine to Sit Supine to Sit: 4: Min assist;HOB flat Supine to Sit Details: Manual facilitation for weight shifting Locomotion  Ambulation Ambulation: Yes Ambulation/Gait Assistance: 2: Max assist Ambulation Distance (Feet): 15 Feet Assistive device: 1 person hand held assist Ambulation/Gait Assistance Details: Tactile cues for weight shifting;Tactile cues for posture;Tactile cues for weight beaing;Verbal cues for sequencing;Verbal cues for precautions/safety;Verbal cues for gait pattern;Manual facilitation for placement;Manual facilitation for weight shifting Ambulation/Gait Assistance Details: Decreased RL weight shift, decreased R knee stability in stance, R foot placement, R foot clearance, step width, and posture. Step-to pattern.  Stairs / Additional Locomotion Stairs: Yes Stairs Assistance: 3: Mod assist Stairs Assistance Details: Verbal cues for safe use of DME/AE;Verbal cues for gait pattern;Verbal cues for precautions/safety;Verbal cues for sequencing;Manual facilitation for weight shifting;Manual facilitation for placement Stairs Assistance Details (indicate cue type and reason): Steadying assist during stepping with LLE Stair Management Technique: Two rails;Step to pattern;Forwards Number of Stairs: 6 Height of Stairs: 6 Wheelchair Mobility Wheelchair Mobility: Yes Wheelchair Assistance: 4: Administrator, sports Details: Verbal cues for precautions/safety;Verbal cues for safe use of DME/AE;Verbal cues for sequencing;Verbal cues for technique;Manual facilitation for placement Wheelchair Propulsion: Left upper extremity;Left lower extremity Wheelchair Parts Management: Needs assistance Distance: 30  Trunk/Postural Assessment  Cervical Assessment Cervical  Assessment: Within Functional Limits Thoracic Assessment Thoracic Assessment: Within Functional Limits Lumbar Assessment Lumbar Assessment: Exceptions to [facility] (Posterior tilt) Postural Control Postural Control: Deficits on evaluation (Forward flexed posture during gait )  Balance Balance Balance Assessed: Yes Static Standing Balance Static Standing - Balance Support: No upper extremity supported Static Standing - Level of Assistance: 4: Min assist Dynamic Standing Balance Dynamic Standing - Balance Support: Bilateral upper extremity supported Dynamic Standing - Level of Assistance: 3: Mod assist Dynamic Standing - Balance Activities: Lateral lean/weight shifting;Forward lean/weight shifting Extremity Assessment  RUE Assessment RUE Assessment: Exceptions to [facility] RUE AROM (degrees) RUE Overall AROM Comments: Pt with limited shoulder flexion, especially  against gravity Right Shoulder Flexion: 80 Degrees Right Shoulder ABduction: 80 Degrees RUE Strength RUE Overall Strength Comments: 4- overall, decreased grasp LUE Assessment LUE Assessment: Within Functional Limits RLE Assessment RLE Assessment: Exceptions to [facility] RLE Strength RLE Overall Strength Comments: R hip and knee ~3+/5 ankel 4/5 LLE Assessment LLE Assessment: Within Functional Limits  FIM:  FIM - Bed/Chair Transfer Bed/Chair Transfer Assistive Devices: Bed rails;Arm rests;HOB elevated Bed/Chair Transfer: 4: Supine > Sit: Min A (steadying Pt. > 75%/lift 1 leg);3: Bed > Chair or W/C: Mod A (lift or lower assist) FIM - Locomotion: Wheelchair Distance: 30 FIM - Locomotion: Ambulation Ambulation/Gait Assistance: 2: Max assist   Refer to Care Plan for Long Term Goals  Recommendations for other services: Neuropsych  Discharge Criteria: Patient will be discharged from PT if patient refuses treatment 3 consecutive times without medical reason, if treatment goals not met, if there is a change in medical status, if patient  makes no progress towards goals or if patient is discharged from hospital.  The above assessment, treatment plan, treatment alternatives and goals were discussed and mutually agreed upon: by patient  Tx initiated following eval for gait training with RW. Pt instructed and provided demonstration on safe RW use, including safe transfer technique chair<>RW. Pt required min/Mod A for safe sit<>stands and Min A for gait 2x25' with RW and R hand grip. Provided R knee facilitation for extensor control and given cues for R foot placement and gait pattern.  Pt also instructed on WC propulsion for hemi-techniuque, but needs shoe for increased efficiency. Pt attempting to use RUE in task, but unable to effectively incorporate due to timing and decreased grip.  Pt requesting to go back to bed due to fatigued and encouraged to stay up in chair later today to increase endurance. Bed alarm on and all needs in reach.   Clydene Laming, PT, DPT  Eulogio Ditch, Jakayden Cancio M 05/03/2012, 12:42 PM

## 2012-05-03 NOTE — Progress Notes (Signed)
Social Work Assessment and Plan Social Work Assessment and Plan  Patient Details  Name: Larry Williams MRN: [id number] Date of Birth: 1935-04-02  Today's Date: 05/03/2012  Problem List:  Patient Active Problem List  Diagnosis  . Left pontine CVA  . HTN (hypertension)  . Diabetes  . Hearing loss   Past Medical History:  Past Medical History  Diagnosis Date  . Hypertension   . Skin cancer, basal cell   . Hearing loss   . Numbness and tingling in right hand   . Frequent urination   . Diabetes mellitus without complication     poorly controlled-BS occasionally 300-400 range.    Past Surgical History:  Past Surgical History  Procedure Laterality Date  . Cholecystectomy    . Surgery scrotal / testicular      for undescended testicle at age 64  . Skin cancer excision     Social History:  has no tobacco, alcohol, and drug history on file.  Family / Support Systems Marital Status: Married Patient Roles: Spouse;Parent;Other (Comment) (Missionary) Spouse/Significant Other: Darel [phone] Children: daughter in [place] Other Supports: Friends here Anticipated Caregiver: Darel Hong Ability/Limitations of Caregiver: Darel Hong is in fairly good health-has some heart issues Caregiver Availability: 24/7 Family Dynamics: Close knit family who are supportive and there for both of them.    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  He is motivated to [employer] and has good feeling back in his affected leg.  The spasms do cause him pain thought. Recent Psychosocial Issues: Other medical issues Pyschiatric History: No history-depression screen score-2.  He is optimisitic he will do well here and return eventually back to [place].  Will monitor while here Substance Abuse History: No issues  Patient / Family Perceptions, Expectations & Goals Pt/Family understanding of illness & functional limitations: Pt and wife are abel to explain his stroke and deficits.  He has had hand issue for a while now.  Both need to speak with nutritionist regarding his diabetic diet, pt eats many sweets.  Both hopeful he will do well here. 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, Father, Missionary, Agricultural consultant, Education officer, environmental, etc Anticipated changes in roles/activities/participation: resume Pt/family expectations/goals: Pt states: " I hope I will be back to my independent level before I leave here."  Wife states: " I will do what I can but would help if he could get around for himself."  [employer]: Other (Comment) (Missionary) Premorbid Home Care/DME Agencies: None Transportation available at discharge: Wife Resource referrals recommended: Support group (specify) ([facility] Support group)  Discharge Planning Living Arrangements: Spouse/significant other Support Systems: Spouse/significant other;Children;Friends/neighbors;Church/faith community Type of Residence: Private residence Insurance Resources: Administrator (specify) (VA-Secondary) Financial Resources: Tree surgeon;Other (Comment) Financial Screen Referred: No Living Expenses: Lives with family Money Management: Spouse;Patient Do you have any problems obtaining your medications?: No Home Management: Wife Patient/Family Preliminary Plans: Return to [place] mobile home until ready to return to  [place].  Daughter is in [place] and other family members as well.  Wife plans to provide care to pt at discharge. Social  Work Anticipated Follow Up Needs: HH/OP;Support Group  Clinical Impression Pleasant gentleman who has good movement and should do well here.  Supportive wife who is willing to assist him at discharge.  Plan to stay down here then eventually return home to [place].  Lucy Chris 05/03/2012, 10:19 AM

## 2012-05-03 NOTE — Care Management Note (Signed)
[facility] Individual Statement of Services  Patient Name:  Larry Williams  Date:  05/03/2012  Welcome to the [facility].  Our goal is to provide you with an individualized program based on your diagnosis and situation, designed to meet your specific needs.  With this comprehensive rehabilitation program, you will be expected to participate in at least 3 hours of rehabilitation therapies Monday-Friday, with modified therapy programming on the weekends.  Your rehabilitation program will include the following services:  Physical Therapy (PT), Occupational Therapy (OT), Speech Therapy (ST), 24 hour per day rehabilitation nursing, Case Management ( Social Worker), Rehabilitation Medicine, Nutrition Services and Pharmacy Services  Weekly team conferences will be held on Wednesday to discuss your progress.  Your Social Worker will talk with you frequently to get your input and to update you on team discussions.  Team conferences with you and your family in attendance may also be held.  Expected length of stay: 10-14 days Overall anticipated outcome: mod/i level  Depending on your progress and recovery, your program may change. Your Social Worker will coordinate services and will keep you informed of any changes. Your Child psychotherapist names and contact numbers are listed  below.  The following services may also be recommended but are not provided by the Inpatient [facility]:   Driving Evaluations  Home Health Rehabiltiation Services  Outpatient Rehabilitatation Servives    Arrangements will be made to provide these services after discharge if needed.  Arrangements include referral to agencies that provide these services.  Your insurance has been verified to be:  Medicare & VA Your primary doctor is:  [place]  Pertinent information will be shared with your doctor and your insurance company.  Social Worker:  Dossie Der, [place]  [phone]  Information discussed with and copy given to patient by: Lucy Chris, 05/03/2012, 10:21 AM

## 2012-05-04 ENCOUNTER — Inpatient Hospital Stay (HOSPITAL_COMMUNITY): Payer: Medicare Other | Admitting: Speech Pathology

## 2012-05-04 ENCOUNTER — Inpatient Hospital Stay (HOSPITAL_COMMUNITY): Payer: Medicare Other | Admitting: Physical Therapy

## 2012-05-04 ENCOUNTER — Inpatient Hospital Stay (HOSPITAL_COMMUNITY): Payer: Medicare Other | Admitting: Occupational Therapy

## 2012-05-04 LAB — MAGNESIUM: Magnesium: 1.7 mg/dL (ref 1.5–2.5)

## 2012-05-04 LAB — GLUCOSE, CAPILLARY
Glucose-Capillary: 209 mg/dL — ABNORMAL HIGH (ref 70–99)
Glucose-Capillary: 223 mg/dL — ABNORMAL HIGH (ref 70–99)

## 2012-05-04 MED ORDER — CLONAZEPAM 0.5 MG PO TABS
0.2500 mg | ORAL_TABLET | Freq: Every day | ORAL | Status: DC
  Administered 2012-05-04 – 2012-05-14 (×11): 0.25 mg via ORAL
  Filled 2012-05-04 (×11): qty 1

## 2012-05-04 NOTE — Progress Notes (Addendum)
Speech Language Pathology Daily Session Note  Patient Details  Name: Larry Williams MRN: [id number] Date of Birth: 01-30-1935  Today's Date: 05/04/2012 Time: 1200-1210 Time Calculation (min): 10 min  Short Term Goals: Week 1: SLP Short Term Goal 1 (Week 1): Patient will consume Dys.3 textures and nectar-thick liuqids with modified independene for use of compensatory strategies. SLP Short Term Goal 2 (Week 1): Patient will consume trials of thin liquids via cup with no ovet s/s of aspiraiton and modified indepenence to follow water protocol procedures. SLP Short Term Goal 3 (Week 1): Patient will increase speech intelligibility at the conversational level with increased wait time to slow rate of speech.    Skilled Therapeutic Interventions: Co-treatment, group session with OT to address safety with swallowing and self-feeding. SLP facilitated session with min verbal cues to utilize slow pace of self-feeding, small potions and to monitor right pocketing.  Patient consumed Dys.3 textures and nectar-thick liquids with cough x1 due to pocketing in combination with large sip.  Hard effortful cough appeared to remove suspected penetrates. Continue with current plan of care.   FIM:  Comprehension Comprehension Mode: Auditory Comprehension: 6-Follows complex conversation/direction: With extra time/assistive device Expression Expression Mode: Verbal Expression: 5-Expresses complex 90% of the time/cues < 10% of the time Social Interaction Social Interaction: 7-Interacts appropriately with others - No medications needed. Problem Solving Problem Solving: 6-Solves complex problems: With extra time Memory Memory: 5-Recognizes or recalls 90% of the time/requires cueing < 10% of the time FIM - Eating Eating Activity: 5: Set-up assist for open containers;5: Set-up assist for cut food;5: Supervision/cues  Pain Pain Assessment Pain Assessment: No/denies pain  Therapy/Group: Group Therapy  Charlane Ferretti., CCC-SLP [phone]  [name] 05/04/2012, 1:27 PM

## 2012-05-04 NOTE — Progress Notes (Signed)
Occupational Therapy Note  Patient Details  Name: Larry Williams MRN: [id number] Date of Birth: [birth year] Today's Date: 05/04/2012  [employer] with SLP 11:30-12:00  No c/o pain  Self feeding with focus on functional use of left UE for tray setup and in bilateral Ue tasks, making needs and wants known, min cuing for oral pocketing, portion control, discussion about good choices regarding his sugar levels   Adan Sis 05/04/2012, 2:46 PM

## 2012-05-04 NOTE — Progress Notes (Signed)
Occupational Therapy Session Note  Patient Details  Name: Larry Williams MRN: [id number] Date of Birth: 1936-01-02  Today's Date: 05/04/2012 Time: 4098-1191 Time Calculation (min): 55 min  Short Term Goals: Week 1:  OT Short Term Goal 1 (Week 1): STG = LTGs due to short ELOS  Skilled Therapeutic Interventions/Progress Updates:    Pt seen for ADL retraining with focus on functional mobility with RW, static standing balance, dynamic sitting balance, and functional use of RUE in self-care tasks.  Pt reports not wanting to bathe this session, however requesting to complete grooming tasks at sink.  Engaged in shaving and styling mustache in standing with min/steady assist for balance and cues for RLE awareness as it tends to buckle as he fatigues.  Pt with diminished use of RUE with grooming, but attempted to groom mustache and would reach up to face to splash water on face.  Dressing completed at EOB with pt requiring physical assistance to don Rt pant leg due to fatigue and decreased balance reactions when reaching forward.  Pt's wife present with multiple questions regarding meds, care, and swallowing precautions with pt able to answer as carryover from previous SLP session.  Pt drank 8 oz nectar thick Sprite zero with coughing initially, but after cues for small slow sips pt with no further coughing episodes.  Therapy Documentation Precautions:  Precautions Precautions: Fall Restrictions Weight Bearing Restrictions: No Pain: Pain Assessment Pain Assessment: No/denies pain  See FIM for current functional status  Therapy/Group: Individual Therapy  Leonette Monarch 05/04/2012, 11:10 AM

## 2012-05-04 NOTE — Progress Notes (Signed)
Patient ID: Larry Williams, male   DOB: March 26, 1935, 77 y.o.   MRN: [id number] Subjective/Complaints: 77 year old LH-male with history of poorly controlled DM, HTN, right hand weakness X 1 month; who was admitted to [facility] on 04/30/12 with slurred speech and right facial weakness. 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 Carotid dopplers done without ICA stenosis. 2D echo with EF 55-60% without wall abnormality. He was evaluated by Dr. Dwan Bolt (neuro) who recommended ASA as patient non complaint with this prior to admission. BSS done by ST and D3, nectar liquids recommended with strict aspiration precautions Problems placing urinal Spasm RLE knee bends only at noc No GERD sx Review of Systems  Neurological: Positive for speech change and weakness.  All other systems reviewed and are negative.   Objective: Vital Signs: Blood pressure 158/83, pulse 53, temperature 97.9 F (36.6 C), temperature source Oral, resp. rate 18, weight 95.255 kg (210 lb), SpO2 97.00%. No results found. Results for orders placed during the hospital encounter of 05/02/12 (from the past 72 hour(s))  GLUCOSE, CAPILLARY     Status: Abnormal   Collection Time    05/02/12  4:43 PM      Result Value Range   Glucose-Capillary 155 (*) 70 - 99 mg/dL  MRSA PCR SCREENING     Status: None   Collection Time    05/02/12  5:11 PM      Result Value Range   MRSA by PCR NEGATIVE  NEGATIVE   Comment:            The GeneXpert MRSA Assay (FDA     approved for NASAL specimens     only), is one component of a     comprehensive MRSA colonization     surveillance program. It is not     intended to diagnose MRSA     infection nor to guide or     monitor treatment for     MRSA infections.  URINALYSIS, ROUTINE W REFLEX MICROSCOPIC     Status: Abnormal   Collection Time    05/02/12  8:09 PM      Result Value Range   Color, Urine YELLOW  YELLOW   APPearance CLEAR  CLEAR   Specific Gravity,  Urine 1.026  1.005 - 1.030   pH 5.5  5.0 - 8.0   Glucose, UA NEGATIVE  NEGATIVE mg/dL   Hgb urine dipstick NEGATIVE  NEGATIVE   Bilirubin Urine NEGATIVE  NEGATIVE   Ketones, ur 15 (*) NEGATIVE mg/dL   Protein, ur NEGATIVE  NEGATIVE mg/dL   Urobilinogen, UA 0.2  0.0 - 1.0 mg/dL   Nitrite NEGATIVE  NEGATIVE   Leukocytes, UA NEGATIVE  NEGATIVE   Comment: MICROSCOPIC NOT DONE ON URINES WITH NEGATIVE PROTEIN, BLOOD, LEUKOCYTES, NITRITE, OR GLUCOSE <1000 mg/dL.  URINE CULTURE     Status: None   Collection Time    05/02/12  8:09 PM      Result Value Range   Specimen Description URINE, CLEAN CATCH     Special Requests NONE     Culture  Setup Time 05/03/2012 01:16     Colony Count NO GROWTH     Culture NO GROWTH     Report Status 05/03/2012 FINAL    GLUCOSE, CAPILLARY     Status: Abnormal   Collection Time    05/02/12  8:36 PM      Result Value Range   Glucose-Capillary 179 (*) 70 - 99 mg/dL  CBC WITH DIFFERENTIAL     Status: Abnormal   Collection Time    05/03/12  6:00 AM      Result Value Range   WBC 12.3 (*) 4.0 - 10.5 K/uL   RBC 4.65  4.22 - 5.81 MIL/uL   Hemoglobin 14.8  13.0 - 17.0 g/dL   HCT 40.9  81.1 - 91.4 %   MCV 89.2  78.0 - 100.0 fL   MCH 31.8  26.0 - 34.0 pg   MCHC 35.7  30.0 - 36.0 g/dL   RDW 78.2  95.6 - 21.3 %   Platelets 250  150 - 400 K/uL   Neutrophils Relative 74  43 - 77 %   Neutro Abs 9.1 (*) 1.7 - 7.7 K/uL   Lymphocytes Relative 19  12 - 46 %   Lymphs Abs 2.3  0.7 - 4.0 K/uL   Monocytes Relative 7  3 - 12 %   Monocytes Absolute 0.8  0.1 - 1.0 K/uL   Eosinophils Relative 1  0 - 5 %   Eosinophils Absolute 0.1  0.0 - 0.7 K/uL   Basophils Relative 0  0 - 1 %   Basophils Absolute 0.0  0.0 - 0.1 K/uL  COMPREHENSIVE METABOLIC PANEL     Status: Abnormal   Collection Time    05/03/12  6:00 AM      Result Value Range   Sodium 140  135 - 145 mEq/L   Potassium 4.8  3.5 - 5.1 mEq/L   Chloride 104  96 - 112 mEq/L   CO2 29  19 - 32 mEq/L   Glucose, Bld 144 (*)  70 - 99 mg/dL   BUN 20  6 - 23 mg/dL   Creatinine, Ser 0.86  0.50 - 1.35 mg/dL   Calcium 9.4  8.4 - 57.8 mg/dL   Total Protein 7.3  6.0 - 8.3 g/dL   Albumin 3.5  3.5 - 5.2 g/dL   AST 20  0 - 37 U/L   ALT 17  0 - 53 U/L   Alkaline Phosphatase 78  39 - 117 U/L   Total Bilirubin 0.5  0.3 - 1.2 mg/dL   GFR calc non Af Amer 81 (*) >90 mL/min   GFR calc Af Amer >90  >90 mL/min   Comment:            The eGFR has been calculated     using the CKD EPI equation.     This calculation has not been     validated in all clinical     situations.     eGFR's persistently     <90 mL/min signify     possible Chronic Kidney Disease.  GLUCOSE, CAPILLARY     Status: Abnormal   Collection Time    05/03/12  7:37 AM      Result Value Range   Glucose-Capillary 150 (*) 70 - 99 mg/dL   Comment 1 Notify RN    GLUCOSE, CAPILLARY     Status: Abnormal   Collection Time    05/03/12 11:31 AM      Result Value Range   Glucose-Capillary 230 (*) 70 - 99 mg/dL  GLUCOSE, CAPILLARY     Status: Abnormal   Collection Time    05/03/12  4:44 PM      Result Value Range   Glucose-Capillary 276 (*) 70 - 99 mg/dL  GLUCOSE, CAPILLARY     Status: Abnormal   Collection Time    05/03/12  9:20 PM  Result Value Range   Glucose-Capillary 237 (*) 70 - 99 mg/dL  GLUCOSE, CAPILLARY     Status: Abnormal   Collection Time    05/04/12  7:23 AM      Result Value Range   Glucose-Capillary 183 (*) 70 - 99 mg/dL   Comment 1 Notify RN        General: No acute distress  HEENT: No evidence of facial droop. Nares clear, tongue midline, no oral lesions  Heart: Regular rate and rhythm no rubs murmurs or extra sounds  Lungs clear to auscultation  Abdomen: Positive bowel sounds soft nontender palpation  Extremities: No clubbing cyanosis or edema  Skin: No evidence of skin breakdown on pressure areas  Neuro: Cranial nerves II through XII are intact  Speech is dysarthric  No evidence of aphasia  No evidence of apraxia  Mild  right upper extremity ataxia finger nose to finger testing  Motor strength is 4 minus at the right deltoid, biceps, triceps, grip  Right hand finger to thumb opposition is slowed with decreased fine motor movements  Right lower extremity for/5 in the hip flexor knee extensor 3 minus/5 in the right ankle dorsiflexor and plantar flexor  Sensation intact to light touch and proprioception in both upper and lower limbs  Left-sided strength is normal   Assessment/Plan: 1. Functional deficits secondary to Left pontine infarct which require 3+ hours per day of interdisciplinary therapy in a comprehensive inpatient rehab setting. Physiatrist is providing close team supervision and 24 hour management of active medical problems listed below. Physiatrist and rehab team continue to assess barriers to discharge/monitor patient progress toward functional and medical goals. FIM: FIM - Bathing Bathing Steps Patient Completed: Chest;Right Arm;Left Arm;Abdomen Bathing: 2: Max-Patient completes 3-4 73f 10 parts or 25-49%  FIM - Upper Body Dressing/Undressing Upper body dressing/undressing steps patient completed: Thread/unthread right sleeve of pullover shirt/dresss;Thread/unthread left sleeve of pullover shirt/dress;Put head through opening of pull over shirt/dress;Pull shirt over trunk Upper body dressing/undressing: 4: Steadying assist FIM - Lower Body Dressing/Undressing Lower body dressing/undressing steps patient completed: Pull underwear up/down;Thread/unthread left underwear leg;Thread/unthread left pants leg;Pull pants up/down Lower body dressing/undressing: 2: Max-Patient completed 25-49% of tasks  FIM - Toileting Toileting steps completed by patient: Performs perineal hygiene Toileting Assistive Devices: Grab bar or rail for support Toileting: 2: Max-Patient completed 1 of 3 steps  FIM - Diplomatic Services operational officer Devices: Grab bars Toilet Transfers: 3-To toilet/BSC: Mod A (lift  or lower assist);3-From toilet/BSC: Mod A (lift or lower assist)  FIM - Bed/Chair Transfer Bed/Chair Transfer Assistive Devices: Bed rails Bed/Chair Transfer: 5: Sit > Supine: Supervision (verbal cues/safety issues);4: Supine > Sit: Min A (steadying Pt. > 75%/lift 1 leg);4: Chair or W/C > Bed: Min A (steadying Pt. > 75%);4: Bed > Chair or W/C: Min A (steadying Pt. > 75%)  FIM - Locomotion: Wheelchair Distance: 30 Locomotion: Wheelchair: 0: Activity did not occur FIM - Locomotion: Ambulation Locomotion: Ambulation Assistive Devices: Other (comment) (HHA) Ambulation/Gait Assistance: 2: Max assist Locomotion: Ambulation: 0: Activity did not occur  Comprehension Comprehension Mode: Auditory Comprehension: 6-Follows complex conversation/direction: With extra time/assistive device  Expression Expression Mode: Verbal Expression: 5-Expresses complex 90% of the time/cues < 10% of the time  Social Interaction Social Interaction: 7-Interacts appropriately with others - No medications needed.  Problem Solving Problem Solving: 6-Solves complex problems: With extra time  Memory Memory: 5-Recognizes or recalls 90% of the time/requires cueing < 10% of the time  Medical Problem List and Plan:  1. DVT Prophylaxis/Anticoagulation: Pharmaceutical: Lovenox  2. Pain Management: Tylenol as needed .  Flexor withdrawal spasms trial klonopin low dose 3. Mood: No evidence of depression or emotional lability. Will monitor.  4. Neuropsych: This patient Is capable of making decisions on his/her own behalf.  5. Diabetes continue Lantus as well as sliding scale will adjust as needed. Increase lantus to 35 Units BID,metformin restarted 6. Hypertension by history currently not on medications will monitor 7.  No symptoms of GERD d/c zantac 8.  Hx hypomag, f/u serum level LOS (Days) 2 A FACE TO FACE EVALUATION WAS PERFORMED  KIRSTEINS,ANDREW E 05/04/2012, 9:25 AM

## 2012-05-04 NOTE — Progress Notes (Signed)
Speech Language Pathology Daily Session Note  Patient Details  Name: Larry Williams MRN: [id number] Date of Birth: 09-30-1935  Today's Date: 05/04/2012 Time: 0900-0930 Time Calculation (min): 30 min  Short Term Goals: Week 1: SLP Short Term Goal 1 (Week 1): Patient will consume Dys.3 textures and nectar-thick liuqids with modified independene for use of compensatory strategies. SLP Short Term Goal 2 (Week 1): Patient will consume trials of thin liquids via cup with no ovet s/s of aspiraiton and modified indepenence to follow water protocol procedures. SLP Short Term Goal 3 (Week 1): Patient will increase speech intelligibility at the conversational level with increased wait time to slow rate of speech.    Skilled Therapeutic Interventions: Skilled treatment session goals were trial of water protocol to determine readiness; however, patient experiencing frequent loose bowel movements this am and SLP facilitated session with min-mod physical and verbal cues to safely use walker to toilet.  SLP also facilitated session with set up assist to perform oral care. Session ended prior to trials of water via cup.  Recommend trials of thin during next individual treatment session. Patient intelligible throughout session today.   FIM:  Comprehension Comprehension Mode: Auditory Comprehension: 6-Follows complex conversation/direction: With extra time/assistive device Expression Expression Mode: Verbal Expression: 5-Expresses complex 90% of the time/cues < 10% of the time Social Interaction Social Interaction: 7-Interacts appropriately with others - No medications needed. Problem Solving Problem Solving: 6-Solves complex problems: With extra time Memory Memory: 5-Recognizes or recalls 90% of the time/requires cueing < 10% of the time FIM - Eating Eating Activity: 5: Set-up assist for open containers;5: Set-up assist for cut food;5: Supervision/cues  Pain Pain Assessment Pain Assessment: No/denies  pain  Therapy/Group: Individual Therapy  Maranda Marte 05/04/2012, 10:55 AM

## 2012-05-04 NOTE — Progress Notes (Addendum)
Physical Therapy Session Note  Patient Details  Name: Larry Williams MRN: [id number] Date of Birth: 10-23-1935  Today's Date: 05/04/2012 Time: 4098-1191 and 4782-9562 Time Calculation (min): 44 min and 30 min  Short Term Goals: Week 1:  PT Short Term Goal 1 (Week 1): Pt will perform bed<>chair transfers with S PT Short Term Goal 2 (Week 1): Pt will ambulate 100' with RW and S PT Short Term Goal 3 (Week 1): Pt will ascend/descend 4 stairs with 1 rail and Min A PT Short Term Goal 4 (Week 1): Pt will demonstrate dynamic standing balance with Min A for 5 min   Skilled Therapeutic Interventions/Progress Updates:   Patient resting in bed after OT. Patient performed supine > sit with bed rails and min A to weight shift to bring trunk fully upright.  Wife present; confirmed D/C plan with patient and wife.  Performed stand pivot to w/c with mod A HHA.  Performed w/c mobility and LE strength and coordination training in w/c with bilat foot propulsion x 150' with min A for sequencing of RLE flexion <> extension to laundry room to retrieve clean clothes from dryer.  Discussed stairs at mobile home for entry/exit; report 4 STE with 2 rails but only able to reach one at a time.  Performed stair negotiation training up and down 3 short stairs x 2 reps with LUE support on rail with patient verbalizing and demonstrating safe step to sequence and mod A overall with verbal cues for safe RLE placement.  Continued gait training with RW with R hand orthosis x 100' in controlled environment with focus on full RLE step length, full lateral weight shift to R and terminal RLE hip and knee extension in stance and trunk elongation on R.  Required one rest break to reposition R hand in orthosis for comfort; also required verbal cues to maintain safe distance to RW.  Patient tolerated well but fatigued quickly with ambulation and presented with increased RLE ER, ADD during swing phase with fatigue.    PM session: patient reporting  fatigue but willing to participate.  Performed transfers w/c <> Nustep with mod HHA with assistance for lateral weight shifting and verbal cues for advancement of RLE and sequencing.  Performed bilat UE and LE coordination, endurance and strength training on Nustep at level 6 x 8 minutes with bilat UE and LE + LE extensor strengthening only with bilat LE only x 2 minutes.  Returned to room and to bed with stand pivot mod HHA with verbal cues for advancement of RLE and for weight shifting.  Performed sit > supine supervision.    Therapy Documentation Precautions:  Precautions Precautions: Fall Restrictions Weight Bearing Restrictions: No Pain: Pain Assessment Pain Assessment: No/denies pain Locomotion : Ambulation Ambulation/Gait Assistance: 3: Mod assist Wheelchair Mobility Distance: 150   See FIM for current functional status  Therapy/Group: Individual Therapy  Edman Circle [facility] 05/04/2012, 12:26 PM

## 2012-05-04 NOTE — Progress Notes (Signed)
Inpatient Diabetes Program Recommendations  AACE/ADA: New Consensus Statement on Inpatient Glycemic Control (2013)  Target Ranges:  Prepandial:   less than 140 mg/dL      Peak postprandial:   less than 180 mg/dL (1-2 hours)      Critically ill patients:  140 - 180 mg/dL   Post-prandial hyperlgycemia Fasting glucose was 144 mg/dL yesterday am and 213 today am.  Noted increase in lantus by total of 10 units per day.  Should be sufficient to cover basal needs as indicated by fasting glucose.  Post prandial glucose levels are elevated (Lantus is not to cover po intake, as it is a flat, basal insulin only).  Inpatient Diabetes Program Recommendations Insulin - Meal Coverage: Pt would benefit more from addition of meal coverage than any other increase in insulin.    Thank you, Lenor Coffin, RN, CNS, Diabetes Coordinator [phone])

## 2012-05-05 ENCOUNTER — Inpatient Hospital Stay (HOSPITAL_COMMUNITY): Payer: Medicare Other | Admitting: Physical Therapy

## 2012-05-05 ENCOUNTER — Inpatient Hospital Stay (HOSPITAL_COMMUNITY): Payer: Medicare Other | Admitting: *Deleted

## 2012-05-05 ENCOUNTER — Inpatient Hospital Stay (HOSPITAL_COMMUNITY): Payer: Medicare Other | Admitting: Speech Pathology

## 2012-05-05 DIAGNOSIS — I69921 Dysphasia following unspecified cerebrovascular disease: Secondary | ICD-10-CM

## 2012-05-05 DIAGNOSIS — I6330 Cerebral infarction due to thrombosis of unspecified cerebral artery: Secondary | ICD-10-CM

## 2012-05-05 DIAGNOSIS — G8110 Spastic hemiplegia affecting unspecified side: Secondary | ICD-10-CM

## 2012-05-05 LAB — GLUCOSE, CAPILLARY

## 2012-05-05 NOTE — Progress Notes (Signed)
Speech Language Pathology Daily Session Note  Patient Details  Name: Larry Williams MRN: [id number] Date of Birth: [birth year]  Today's Date: 05/05/2012 Time: 4098-1191 Time Calculation (min): 25 min  Short Term Goals: Week 1: SLP Short Term Goal 1 (Week 1): Patient will consume Dys.3 textures and nectar-thick liuqids with modified independene for use of compensatory strategies. SLP Short Term Goal 2 (Week 1): Patient will consume trials of thin liquids via cup with no ovet s/s of aspiraiton and modified indepenence to follow water protocol procedures. SLP Short Term Goal 3 (Week 1): Patient will increase speech intelligibility at the conversational level with increased wait time to slow rate of speech.    Skilled Therapeutic Interventions: Co-treatment, group session with OT to address safety with swallowing and self-feeding. SLP facilitated session with supervision level cues to utilize slow pace of self-feeding, small portions, and to monitor right pocketing. Patient consumed Dys.3 textures and nectar-thick liquids with cough x2 that pt reports was due to "phlegm." No other overt s/sx of aspiration or penetration were observed. Continue with current plan of care.    FIM:  Comprehension Comprehension Mode: Auditory Comprehension: 6-Follows complex conversation/direction: With extra time/assistive device Expression Expression Mode: Verbal Expression: 5-Expresses complex 90% of the time/cues < 10% of the time Social Interaction Social Interaction: 6-Interacts appropriately with others with medication or extra time (anti-anxiety, antidepressant). Problem Solving Problem Solving: 5-Solves basic 90% of the time/requires cueing < 10% of the time Memory Memory: 5-Recognizes or recalls 90% of the time/requires cueing < 10% of the time FIM - Eating Eating Activity: 5: Supervision/cues;5: Set-up assist for open containers;5: Needs verbal cues/supervision  Pain Pain Assessment Pain Assessment:  No/denies pain  Therapy/Group: Group Therapy   Maxcine Ham, M.A. CF-SLP  Maxcine Ham 05/05/2012, 3:08 PM

## 2012-05-05 NOTE — Progress Notes (Signed)
Occupational Therapy Session Note  Patient Details  Name: Larry Williams MRN: [id number] Date of Birth: [birth year]  Today's Date: 05/05/2012 Time: 1130-1145 Time Calculation (min): 15 min  Short Term Goals: Week 1:  OT Short Term Goal 1 (Week 1): STG = LTGs due to short ELOS  Skilled Therapeutic Interventions: Co-treatment group session with SLP to with emphasis on improved functional use of right upper extremity, fine motor control, proper positioning during seated ADL, w/c positioning, and safety awareness as evidenced by attention to need to chew and move food pocketed in his right cheek.   Patient required cues and encouragement to include right hand despite loss of use as dominant hand.   Patient denied food was pocketed in his cheek when cued to move and swallow food observed in his right cheek 1 time during this session.   Therapy Documentation Precautions:  Precautions Precautions: Fall Restrictions Weight Bearing Restrictions: No  Pain: No pain reported  Therapy/Group: Group Therapy  Georgeanne Nim 05/05/2012, 4:37 PM

## 2012-05-05 NOTE — Progress Notes (Signed)
Patient ID: Larry Williams, male   DOB: January 03, 1936, 77 y.o.   MRN: [id number] Subjective/Complaints: 78 year old LH-male with history of poorly controlled DM, HTN, right hand weakness X 1 month; who was admitted to [facility] on 04/30/12 with slurred speech and right facial weakness. 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 Carotid dopplers done without ICA stenosis. 2D echo with EF 55-60% without wall abnormality. He was evaluated by Dr. Dwan Bolt (neuro) who recommended ASA as patient non complaint with this prior to admission. BSS done by ST and D3, nectar liquids recommended with strict aspiration precautions  Complains of some nausea. Feels that he might be a little dry  Review of Systems  Neurological: Positive for speech change and weakness.  All other systems reviewed and are negative.   Objective: Vital Signs: Blood pressure 123/73, pulse 54, temperature 98.2 F (36.8 C), temperature source Oral, resp. rate 18, weight 95.255 kg (210 lb), SpO2 96.00%. No results found. Results for orders placed during the hospital encounter of 05/02/12 (from the past 72 hour(s))  GLUCOSE, CAPILLARY     Status: Abnormal   Collection Time    05/02/12  4:43 PM      Result Value Range   Glucose-Capillary 155 (*) 70 - 99 mg/dL  MRSA PCR SCREENING     Status: None   Collection Time    05/02/12  5:11 PM      Result Value Range   MRSA by PCR NEGATIVE  NEGATIVE   Comment:            The GeneXpert MRSA Assay (FDA     approved for NASAL specimens     only), is one component of a     comprehensive MRSA colonization     surveillance program. It is not     intended to diagnose MRSA     infection nor to guide or     monitor treatment for     MRSA infections.  URINALYSIS, ROUTINE W REFLEX MICROSCOPIC     Status: Abnormal   Collection Time    05/02/12  8:09 PM      Result Value Range   Color, Urine YELLOW  YELLOW   APPearance CLEAR  CLEAR   Specific Gravity, Urine  1.026  1.005 - 1.030   pH 5.5  5.0 - 8.0   Glucose, UA NEGATIVE  NEGATIVE mg/dL   Hgb urine dipstick NEGATIVE  NEGATIVE   Bilirubin Urine NEGATIVE  NEGATIVE   Ketones, ur 15 (*) NEGATIVE mg/dL   Protein, ur NEGATIVE  NEGATIVE mg/dL   Urobilinogen, UA 0.2  0.0 - 1.0 mg/dL   Nitrite NEGATIVE  NEGATIVE   Leukocytes, UA NEGATIVE  NEGATIVE   Comment: MICROSCOPIC NOT DONE ON URINES WITH NEGATIVE PROTEIN, BLOOD, LEUKOCYTES, NITRITE, OR GLUCOSE <1000 mg/dL.  URINE CULTURE     Status: None   Collection Time    05/02/12  8:09 PM      Result Value Range   Specimen Description URINE, CLEAN CATCH     Special Requests NONE     Culture  Setup Time 05/03/2012 01:16     Colony Count NO GROWTH     Culture NO GROWTH     Report Status 05/03/2012 FINAL    GLUCOSE, CAPILLARY     Status: Abnormal   Collection Time    05/02/12  8:36 PM      Result Value Range   Glucose-Capillary 179 (*) 70 - 99 mg/dL  CBC WITH DIFFERENTIAL     Status: Abnormal   Collection Time    05/03/12  6:00 AM      Result Value Range   WBC 12.3 (*) 4.0 - 10.5 K/uL   RBC 4.65  4.22 - 5.81 MIL/uL   Hemoglobin 14.8  13.0 - 17.0 g/dL   HCT 13.0  86.5 - 78.4 %   MCV 89.2  78.0 - 100.0 fL   MCH 31.8  26.0 - 34.0 pg   MCHC 35.7  30.0 - 36.0 g/dL   RDW 69.6  29.5 - 28.4 %   Platelets 250  150 - 400 K/uL   Neutrophils Relative 74  43 - 77 %   Neutro Abs 9.1 (*) 1.7 - 7.7 K/uL   Lymphocytes Relative 19  12 - 46 %   Lymphs Abs 2.3  0.7 - 4.0 K/uL   Monocytes Relative 7  3 - 12 %   Monocytes Absolute 0.8  0.1 - 1.0 K/uL   Eosinophils Relative 1  0 - 5 %   Eosinophils Absolute 0.1  0.0 - 0.7 K/uL   Basophils Relative 0  0 - 1 %   Basophils Absolute 0.0  0.0 - 0.1 K/uL  COMPREHENSIVE METABOLIC PANEL     Status: Abnormal   Collection Time    05/03/12  6:00 AM      Result Value Range   Sodium 140  135 - 145 mEq/L   Potassium 4.8  3.5 - 5.1 mEq/L   Chloride 104  96 - 112 mEq/L   CO2 29  19 - 32 mEq/L   Glucose, Bld 144 (*) 70 -  99 mg/dL   BUN 20  6 - 23 mg/dL   Creatinine, Ser 1.32  0.50 - 1.35 mg/dL   Calcium 9.4  8.4 - 44.0 mg/dL   Total Protein 7.3  6.0 - 8.3 g/dL   Albumin 3.5  3.5 - 5.2 g/dL   AST 20  0 - 37 U/L   ALT 17  0 - 53 U/L   Alkaline Phosphatase 78  39 - 117 U/L   Total Bilirubin 0.5  0.3 - 1.2 mg/dL   GFR calc non Af Amer 81 (*) >90 mL/min   GFR calc Af Amer >90  >90 mL/min   Comment:            The eGFR has been calculated     using the CKD EPI equation.     This calculation has not been     validated in all clinical     situations.     eGFR's persistently     <90 mL/min signify     possible Chronic Kidney Disease.  GLUCOSE, CAPILLARY     Status: Abnormal   Collection Time    05/03/12  7:37 AM      Result Value Range   Glucose-Capillary 150 (*) 70 - 99 mg/dL   Comment 1 Notify RN    GLUCOSE, CAPILLARY     Status: Abnormal   Collection Time    05/03/12 11:31 AM      Result Value Range   Glucose-Capillary 230 (*) 70 - 99 mg/dL  GLUCOSE, CAPILLARY     Status: Abnormal   Collection Time    05/03/12  4:44 PM      Result Value Range   Glucose-Capillary 276 (*) 70 - 99 mg/dL  GLUCOSE, CAPILLARY     Status: Abnormal   Collection Time    05/03/12  9:20 PM  Result Value Range   Glucose-Capillary 237 (*) 70 - 99 mg/dL  GLUCOSE, CAPILLARY     Status: Abnormal   Collection Time    05/04/12  7:23 AM      Result Value Range   Glucose-Capillary 183 (*) 70 - 99 mg/dL   Comment 1 Notify RN    MAGNESIUM     Status: None   Collection Time    05/04/12 10:12 AM      Result Value Range   Magnesium 1.7  1.5 - 2.5 mg/dL  GLUCOSE, CAPILLARY     Status: Abnormal   Collection Time    05/04/12 11:32 AM      Result Value Range   Glucose-Capillary 272 (*) 70 - 99 mg/dL   Comment 1 Notify RN    GLUCOSE, CAPILLARY     Status: Abnormal   Collection Time    05/04/12  4:24 PM      Result Value Range   Glucose-Capillary 209 (*) 70 - 99 mg/dL  GLUCOSE, CAPILLARY     Status: Abnormal    Collection Time    05/04/12  9:06 PM      Result Value Range   Glucose-Capillary 223 (*) 70 - 99 mg/dL  GLUCOSE, CAPILLARY     Status: Abnormal   Collection Time    05/05/12  7:55 AM      Result Value Range   Glucose-Capillary 161 (*) 70 - 99 mg/dL   Comment 1 Notify RN        General: No acute distress  HEENT: No evidence of facial droop. Nares clear, tongue midline, no oral lesions  Heart: Regular rate and rhythm no rubs murmurs or extra sounds  Lungs clear to auscultation  Abdomen: Positive bowel sounds soft nontender palpation  Extremities: No clubbing cyanosis or edema  Skin: No evidence of skin breakdown on pressure areas  Neuro: Cranial nerves II through XII are intact  Speech is dysarthric  No evidence of aphasia  No evidence of apraxia  Mild right upper extremity ataxia finger nose to finger testing  Motor strength is 4 minus at the right deltoid, biceps, triceps, grip  Right hand finger to thumb opposition is slowed with decreased fine motor movements  Right lower extremity for/5 in the hip flexor knee extensor 3 minus/5 in the right ankle dorsiflexor and plantar flexor  Sensation intact to light touch and proprioception in both upper and lower limbs  Left-sided strength is normal   Assessment/Plan: 1. Functional deficits secondary to Left pontine infarct which require 3+ hours per day of interdisciplinary therapy in a comprehensive inpatient rehab setting. Physiatrist is providing close team supervision and 24 hour management of active medical problems listed below. Physiatrist and rehab team continue to assess barriers to discharge/monitor patient progress toward functional and medical goals. FIM: FIM - Bathing Bathing Steps Patient Completed: Chest;Right Arm;Left Arm;Abdomen Bathing: 2: Max-Patient completes 3-4 56f 10 parts or 25-49%  FIM - Upper Body Dressing/Undressing Upper body dressing/undressing steps patient completed: Thread/unthread right sleeve of  pullover shirt/dresss;Thread/unthread left sleeve of pullover shirt/dress;Put head through opening of pull over shirt/dress;Pull shirt over trunk Upper body dressing/undressing: 4: Steadying assist FIM - Lower Body Dressing/Undressing Lower body dressing/undressing steps patient completed: Thread/unthread left underwear leg;Pull underwear up/down;Thread/unthread left pants leg;Pull pants up/down Lower body dressing/undressing: 3: Mod-Patient completed 50-74% of tasks  FIM - Toileting Toileting steps completed by patient: Performs perineal hygiene Toileting Assistive Devices: Grab bar or rail for support Toileting: 2: Max-Patient completed 1 of  3 steps  FIM - Diplomatic Services operational officer Devices: Grab bars Toilet Transfers: 3-To toilet/BSC: Mod A (lift or lower assist);3-From toilet/BSC: Mod A (lift or lower assist)  FIM - Bed/Chair Transfer Bed/Chair Transfer Assistive Devices: Bed rails Bed/Chair Transfer: 4: Supine > Sit: Min A (steadying Pt. > 75%/lift 1 leg);3: Bed > Chair or W/C: Mod A (lift or lower assist);3: Chair or W/C > Bed: Mod A (lift or lower assist)  FIM - Locomotion: Wheelchair Distance: 150 Locomotion: Wheelchair: 4: Travels 150 ft or more: maneuvers on rugs and over door sillls with minimal assistance (Pt.>75%) FIM - Locomotion: Ambulation Locomotion: Ambulation Assistive Devices: Designer, industrial/product Ambulation/Gait Assistance: 3: Mod assist Locomotion: Ambulation: 2: Travels 50 - 149 ft with moderate assistance (Pt: 50 - 74%)  Comprehension Comprehension Mode: Auditory Comprehension: 6-Follows complex conversation/direction: With extra time/assistive device  Expression Expression Mode: Verbal Expression: 5-Expresses complex 90% of the time/cues < 10% of the time  Social Interaction Social Interaction: 6-Interacts appropriately with others with medication or extra time (anti-anxiety, antidepressant).  Problem Solving Problem Solving: 6-Solves  complex problems: With extra time  Memory Memory: 5-Recognizes or recalls 90% of the time/requires cueing < 10% of the time  Medical Problem List and Plan:  1. DVT Prophylaxis/Anticoagulation: Pharmaceutical: Lovenox  2. Pain Management: Tylenol as needed .  Flexor withdrawal spasms trial klonopin low dose 3. Mood: No evidence of depression or emotional lability. Will monitor.  4. Neuropsych: This patient Is capable of making decisions on his/her own behalf.  5. Diabetes continue Lantus as well as sliding scale will adjust as needed. Increased lantus to 35 Units BID,metformin restarted as well this week. May need further titration--follow today before making more changes 6. Hypertension by history currently not on medications will monitor 7.  No symptoms of GERD d/c zantac 8.  Hx hypomag/FEN:  f/u serum level  -encourage fluids.   -recheck lytes, mg++ on monday   LOS (Days) 3 A FACE TO FACE EVALUATION WAS PERFORMED  Mihail Prettyman T 05/05/2012, 9:05 AM

## 2012-05-05 NOTE — Progress Notes (Addendum)
Physical Therapy Note  Patient Details  Name: Larry Williams MRN: [id number] Date of Birth: 25-Feb-1935 Today's Date: 05/05/2012  1030-1115  (45 min)  1st session Pain:  None Individual session  1st session:  Engaged in functional mobility with RW, transfers, sit to stand dynamic sitting balance, RUE NMRE.  Pt doffed clothes in wc.  Ambulated with minimal assist to shower area with RW.  Performed stand pivot transfer with minimal cues and assist for motor planning for turning.  Pt. Bathed with minimal assist.  Dressed edge of Bench with increased time and  max assist with socks.  Pt. Ambulated back to wc and sat with call bell in place.   Wife present during session.    Time:  1400-1500  (60 min) 2nd session Pain:  None Group session 2nd session:  Engaged in therapeutic activity group with focus on RUE NMRE, dynamic standing balance, endurance, sit to stand.  Pt. Had difficulty with releasing RUE during ring toss, but did better with bean bag. Needed multiple cues to push from wc instead of pulling from walker.    Humberto Seals 05/05/2012, 11:29 AM

## 2012-05-05 NOTE — Progress Notes (Signed)
Physical Therapy Note  Patient Details  Name: Larry Williams MRN: [id number] Date of Birth: 1935/08/01 Today's Date: 05/05/2012  7829-5621 (55 minutes) individual Pain: no reported pain Focus of treatment: Therapeutic exercise focused on RT LE strengthening/control; activity tolerance; Gait training with RW; Neuro re-ed RT LE Treatment: Pt in bed upon arrival requesting to use bathroom; gait with RW + hand splint min assist to bathroom; pt used safety rail to stand during hygiene with assist; transfers wc >< Nustep RW min assist; Nustep Level 6 X 10 minutes (perceived exertion = 13 somewhat hard; Neuro re-ed RT LE- stepping up to 6 inch step on right (hip flexion) 2 X 10; up/down 4 inch step RT LE (concentric/eccentric quad control); standing RT hip abduction X 10; gait 65 feet x 1 RW + hand splint min assist with vcs for safe positioning of RW  during stand to sit and reminder to remove Rt hand from splint before sitting.   Brinlee Gambrell,JIM 05/05/2012, 9:15 AM

## 2012-05-06 ENCOUNTER — Inpatient Hospital Stay (HOSPITAL_COMMUNITY): Payer: Medicare Other | Admitting: Physical Therapy

## 2012-05-06 LAB — GLUCOSE, CAPILLARY
Glucose-Capillary: 183 mg/dL — ABNORMAL HIGH (ref 70–99)
Glucose-Capillary: 204 mg/dL — ABNORMAL HIGH (ref 70–99)

## 2012-05-06 MED ORDER — INSULIN GLARGINE 100 UNIT/ML ~~LOC~~ SOLN
40.0000 [IU] | Freq: Two times a day (BID) | SUBCUTANEOUS | Status: DC
  Administered 2012-05-06 – 2012-05-15 (×19): 40 [IU] via SUBCUTANEOUS
  Filled 2012-05-06 (×24): qty 0.4

## 2012-05-06 MED ORDER — METHOCARBAMOL 500 MG PO TABS
500.0000 mg | ORAL_TABLET | Freq: Four times a day (QID) | ORAL | Status: DC | PRN
  Administered 2012-05-06 – 2012-05-14 (×11): 500 mg via ORAL
  Filled 2012-05-06 (×11): qty 1

## 2012-05-06 NOTE — Progress Notes (Signed)
Physical Therapy Note  Patient Details  Name: Larry Williams MRN: [id number] Date of Birth: Nov 14, 1935 Today's Date: 05/06/2012  1330-1415 (45 minutes) individual Pain: no reported pain Focus of treatment: Therapeutic exercise focused on RT LE strengthening/activity tolerance; gait training including steps Treatment: Nustep Level 5 X 15 minutes; gait up/down 2 steps with rail right (HHA on left) x 2 with min vcs for LE sequencing; gait 80 feet RW min assist . Pt reports fatigue post session.    Bryannah Boston,JIM 05/06/2012, 2:51 PM

## 2012-05-06 NOTE — Progress Notes (Signed)
Patient ID: Larry Williams, male   DOB: 03/19/1935, 77 y.o.   MRN: [id number] Subjective/Complaints: 77 year old LH-male with history of poorly controlled DM, HTN, right hand weakness X 1 month; who was admitted to [facility] on 04/30/12 with slurred speech and right facial weakness. 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 Carotid dopplers done without ICA stenosis. 2D echo with EF 55-60% without wall abnormality. He was evaluated by Dr. Dwan Bolt (neuro) who recommended ASA as patient non complaint with this prior to admission. BSS done by ST and D3, nectar liquids recommended with strict aspiration precautions  No specific complaints this am. Slept well.   Review of Systems  Neurological: Positive for speech change and weakness.  All other systems reviewed and are negative.   Objective: Vital Signs: Blood pressure 139/76, pulse 57, temperature 97.4 F (36.3 C), temperature source Oral, resp. rate 20, weight 95.255 kg (210 lb), SpO2 95.00%. No results found. Results for orders placed during the hospital encounter of 05/02/12 (from the past 72 hour(s))  GLUCOSE, CAPILLARY     Status: Abnormal   Collection Time    05/03/12 11:31 AM      Result Value Range   Glucose-Capillary 230 (*) 70 - 99 mg/dL  GLUCOSE, CAPILLARY     Status: Abnormal   Collection Time    05/03/12  4:44 PM      Result Value Range   Glucose-Capillary 276 (*) 70 - 99 mg/dL  GLUCOSE, CAPILLARY     Status: Abnormal   Collection Time    05/03/12  9:20 PM      Result Value Range   Glucose-Capillary 237 (*) 70 - 99 mg/dL  GLUCOSE, CAPILLARY     Status: Abnormal   Collection Time    05/04/12  7:23 AM      Result Value Range   Glucose-Capillary 183 (*) 70 - 99 mg/dL   Comment 1 Notify RN    MAGNESIUM     Status: None   Collection Time    05/04/12 10:12 AM      Result Value Range   Magnesium 1.7  1.5 - 2.5 mg/dL  GLUCOSE, CAPILLARY     Status: Abnormal   Collection Time   05/04/12 11:32 AM      Result Value Range   Glucose-Capillary 272 (*) 70 - 99 mg/dL   Comment 1 Notify RN    GLUCOSE, CAPILLARY     Status: Abnormal   Collection Time    05/04/12  4:24 PM      Result Value Range   Glucose-Capillary 209 (*) 70 - 99 mg/dL  GLUCOSE, CAPILLARY     Status: Abnormal   Collection Time    05/04/12  9:06 PM      Result Value Range   Glucose-Capillary 223 (*) 70 - 99 mg/dL  GLUCOSE, CAPILLARY     Status: Abnormal   Collection Time    05/05/12  7:55 AM      Result Value Range   Glucose-Capillary 161 (*) 70 - 99 mg/dL   Comment 1 Notify RN    GLUCOSE, CAPILLARY     Status: Abnormal   Collection Time    05/05/12 11:17 AM      Result Value Range   Glucose-Capillary 169 (*) 70 - 99 mg/dL   Comment 1 Notify RN    GLUCOSE, CAPILLARY     Status: Abnormal   Collection Time    05/05/12  4:52 PM  Result Value Range   Glucose-Capillary 207 (*) 70 - 99 mg/dL   Comment 1 Notify RN    GLUCOSE, CAPILLARY     Status: Abnormal   Collection Time    05/05/12  9:17 PM      Result Value Range   Glucose-Capillary 205 (*) 70 - 99 mg/dL   Comment 1 Notify RN    GLUCOSE, CAPILLARY     Status: Abnormal   Collection Time    05/06/12  7:45 AM      Result Value Range   Glucose-Capillary 183 (*) 70 - 99 mg/dL   Comment 1 Notify RN        General: No acute distress  HEENT: No evidence of facial droop. Nares clear, tongue midline, no oral lesions  Heart: Regular rate and rhythm no rubs murmurs or extra sounds  Lungs clear to auscultation  Abdomen: Positive bowel sounds soft nontender palpation  Extremities: No clubbing cyanosis or edema  Skin: No evidence of skin breakdown on pressure areas  Neuro: Cranial nerves II through XII are intact  Speech is dysarthric  No evidence of aphasia  No evidence of apraxia  Mild right upper extremity ataxia finger nose to finger testing  Motor strength is 4 minus at the right deltoid, biceps, triceps, grip  Right hand finger to  thumb opposition is slowed with decreased fine motor movements  Right lower extremity for/5 in the hip flexor knee extensor 3 minus/5 in the right ankle dorsiflexor and plantar flexor  Sensation intact to light touch and proprioception in both upper and lower limbs  Left-sided strength is normal   Assessment/Plan: 1. Functional deficits secondary to Left pontine infarct which require 3+ hours per day of interdisciplinary therapy in a comprehensive inpatient rehab setting. Physiatrist is providing close team supervision and 24 hour management of active medical problems listed below. Physiatrist and rehab team continue to assess barriers to discharge/monitor patient progress toward functional and medical goals. FIM: FIM - Bathing Bathing Steps Patient Completed: Chest;Right Arm;Left Arm;Abdomen;Right upper leg;Left upper leg;Front perineal area;Buttocks Bathing: 4: Min-Patient completes 8-9 50f 10 parts or 75+ percent  FIM - Upper Body Dressing/Undressing Upper body dressing/undressing steps patient completed: Thread/unthread right sleeve of pullover shirt/dresss;Thread/unthread left sleeve of pullover shirt/dress;Put head through opening of pull over shirt/dress;Pull shirt over trunk Upper body dressing/undressing: 4: Steadying assist FIM - Lower Body Dressing/Undressing Lower body dressing/undressing steps patient completed: Thread/unthread left underwear leg;Pull underwear up/down;Thread/unthread left pants leg;Pull pants up/down;Don/Doff right sock;Don/Doff left sock Lower body dressing/undressing: 4: Min-Patient completed 75 plus % of tasks  FIM - Toileting Toileting steps completed by patient: Performs perineal hygiene Toileting Assistive Devices: Grab bar or rail for support Toileting: 2: Max-Patient completed 1 of 3 steps  FIM - Diplomatic Services operational officer Devices: Grab bars Toilet Transfers: 3-To toilet/BSC: Mod A (lift or lower assist);3-From toilet/BSC: Mod A  (lift or lower assist)  FIM - Bed/Chair Transfer Bed/Chair Transfer Assistive Devices: Bed rails Bed/Chair Transfer: 4: Sit > Supine: Min A (steadying pt. > 75%/lift 1 leg);4: Bed > Chair or W/C: Min A (steadying Pt. > 75%)  FIM - Locomotion: Wheelchair Distance: 150 Locomotion: Wheelchair: 4: Travels 150 ft or more: maneuvers on rugs and over door sillls with minimal assistance (Pt.>75%) FIM - Locomotion: Ambulation Locomotion: Ambulation Assistive Devices: Walker - Rolling (with rt hand splint) Ambulation/Gait Assistance: 4: Min assist Locomotion: Ambulation: 2: Travels 50 - 149 ft with minimal assistance (Pt.>75%)  Comprehension Comprehension Mode: Auditory Comprehension: 6-Follows complex  conversation/direction: With extra time/assistive device  Expression Expression Mode: Verbal Expression: 5-Expresses complex 90% of the time/cues < 10% of the time  Social Interaction Social Interaction: 6-Interacts appropriately with others with medication or extra time (anti-anxiety, antidepressant).  Problem Solving Problem Solving: 5-Solves basic 90% of the time/requires cueing < 10% of the time  Memory Memory: 5-Recognizes or recalls 90% of the time/requires cueing < 10% of the time  Medical Problem List and Plan:  1. DVT Prophylaxis/Anticoagulation: Pharmaceutical: Lovenox  2. Pain Management: Tylenol as needed .  Flexor withdrawal spasms trial klonopin low dose 3. Mood: No evidence of depression or emotional lability. Will monitor.  4. Neuropsych: This patient Is capable of making decisions on his/her own behalf.  5. Diabetes continue Lantus as well as sliding scale will adjust as needed. Increase lantus to 40 Units BID,metformin restarted as well this week. 6. Hypertension by history currently not on medications will monitor 7.  No symptoms of GERD d/c zantac 8.  Hx hypomag/FEN:  f/u serum level  -encourage fluids.   -recheck lytes, mg++ on monday   LOS (Days) 4 A FACE TO FACE  EVALUATION WAS PERFORMED  SWARTZ,ZACHARY T 05/06/2012, 8:27 AM

## 2012-05-07 ENCOUNTER — Inpatient Hospital Stay (HOSPITAL_COMMUNITY): Payer: Medicare Other

## 2012-05-07 ENCOUNTER — Inpatient Hospital Stay (HOSPITAL_COMMUNITY): Payer: Medicare Other | Admitting: Physical Therapy

## 2012-05-07 ENCOUNTER — Inpatient Hospital Stay (HOSPITAL_COMMUNITY): Payer: Medicare Other | Admitting: *Deleted

## 2012-05-07 ENCOUNTER — Inpatient Hospital Stay (HOSPITAL_COMMUNITY): Payer: Medicare Other | Admitting: Occupational Therapy

## 2012-05-07 DIAGNOSIS — I6330 Cerebral infarction due to thrombosis of unspecified cerebral artery: Secondary | ICD-10-CM

## 2012-05-07 DIAGNOSIS — G8110 Spastic hemiplegia affecting unspecified side: Secondary | ICD-10-CM

## 2012-05-07 DIAGNOSIS — I69921 Dysphasia following unspecified cerebrovascular disease: Secondary | ICD-10-CM

## 2012-05-07 LAB — MAGNESIUM: Magnesium: 2 mg/dL (ref 1.5–2.5)

## 2012-05-07 LAB — GLUCOSE, CAPILLARY: Glucose-Capillary: 152 mg/dL — ABNORMAL HIGH (ref 70–99)

## 2012-05-07 LAB — BASIC METABOLIC PANEL
Calcium: 9.4 mg/dL (ref 8.4–10.5)
GFR calc non Af Amer: 87 mL/min — ABNORMAL LOW (ref >90)
Glucose, Bld: 145 mg/dL — ABNORMAL HIGH (ref 70–99)
Sodium: 138 mEq/L (ref 135–145)

## 2012-05-07 NOTE — Progress Notes (Signed)
Patient ID: Larry Williams, male   DOB: April 02, 1935, 77 y.o.   MRN: [id number] Subjective/Complaints: 77 year old LH-male with history of poorly controlled DM, HTN, right hand weakness X 1 month; who was admitted to [facility] on 04/30/12 with slurred speech and right facial weakness. 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 Carotid dopplers done without ICA stenosis. 2D echo with EF 55-60% without wall abnormality. He was evaluated by Dr. Dwan Bolt (neuro) who recommended ASA as patient non complaint with this prior to admission. BSS done by ST and D3, nectar liquids recommended with strict aspiration precautions  Loose stools with inc R hand stiff and painful, started one mo prior to CVA  Review of Systems  Neurological: Positive for speech change and weakness.  All other systems reviewed and are negative.   Objective: Vital Signs: Blood pressure 161/97, pulse 97, temperature 99 F (37.2 C), temperature source Oral, resp. rate 18, weight 95.255 kg (210 lb), SpO2 97.00%. No results found. Results for orders placed during the hospital encounter of 05/02/12 (from the past 72 hour(s))  MAGNESIUM     Status: None   Collection Time    05/04/12 10:12 AM      Result Value Range   Magnesium 1.7  1.5 - 2.5 mg/dL  GLUCOSE, CAPILLARY     Status: Abnormal   Collection Time    05/04/12 11:32 AM      Result Value Range   Glucose-Capillary 272 (*) 70 - 99 mg/dL   Comment 1 Notify RN    GLUCOSE, CAPILLARY     Status: Abnormal   Collection Time    05/04/12  4:24 PM      Result Value Range   Glucose-Capillary 209 (*) 70 - 99 mg/dL  GLUCOSE, CAPILLARY     Status: Abnormal   Collection Time    05/04/12  9:06 PM      Result Value Range   Glucose-Capillary 223 (*) 70 - 99 mg/dL  GLUCOSE, CAPILLARY     Status: Abnormal   Collection Time    05/05/12  7:55 AM      Result Value Range   Glucose-Capillary 161 (*) 70 - 99 mg/dL   Comment 1 Notify RN    GLUCOSE,  CAPILLARY     Status: Abnormal   Collection Time    05/05/12 11:17 AM      Result Value Range   Glucose-Capillary 169 (*) 70 - 99 mg/dL   Comment 1 Notify RN    GLUCOSE, CAPILLARY     Status: Abnormal   Collection Time    05/05/12  4:52 PM      Result Value Range   Glucose-Capillary 207 (*) 70 - 99 mg/dL   Comment 1 Notify RN    GLUCOSE, CAPILLARY     Status: Abnormal   Collection Time    05/05/12  9:17 PM      Result Value Range   Glucose-Capillary 205 (*) 70 - 99 mg/dL   Comment 1 Notify RN    GLUCOSE, CAPILLARY     Status: Abnormal   Collection Time    05/06/12  7:45 AM      Result Value Range   Glucose-Capillary 183 (*) 70 - 99 mg/dL   Comment 1 Notify RN    GLUCOSE, CAPILLARY     Status: Abnormal   Collection Time    05/06/12 11:51 AM      Result Value Range   Glucose-Capillary 183 (*) 70 -  99 mg/dL   Comment 1 Notify RN    GLUCOSE, CAPILLARY     Status: Abnormal   Collection Time    05/06/12  5:13 PM      Result Value Range   Glucose-Capillary 204 (*) 70 - 99 mg/dL   Comment 1 Notify RN    GLUCOSE, CAPILLARY     Status: Abnormal   Collection Time    05/06/12  9:17 PM      Result Value Range   Glucose-Capillary 140 (*) 70 - 99 mg/dL  GLUCOSE, CAPILLARY     Status: Abnormal   Collection Time    05/07/12  7:10 AM      Result Value Range   Glucose-Capillary 154 (*) 70 - 99 mg/dL   Comment 1 Notify RN        General: No acute distress  HEENT: No evidence of facial droop. Nares clear, tongue midline, no oral lesions  Heart: Regular rate and rhythm no rubs murmurs or extra sounds  Lungs clear to auscultation  Abdomen: Positive bowel sounds soft nontender palpation  Extremities: No clubbing cyanosis or edema  Skin: No evidence of skin breakdown on pressure areas  Neuro: Cranial nerves II through XII are intact  Speech is dysarthric  No evidence of aphasia  No evidence of apraxia  Mild right upper extremity ataxia finger nose to finger testing  Motor  strength is 4 minus at the right deltoid, biceps, triceps, grip  Right hand finger to thumb opposition is slowed with decreased fine motor movements  Right lower extremity for/5 in the hip flexor knee extensor 3 minus/5 in the right ankle dorsiflexor and plantar flexor  Sensation intact to light touch and proprioception in both upper and lower limbs  Left-sided strength is normal   Assessment/Plan: 1. Functional deficits secondary to Left pontine infarct which require 3+ hours per day of interdisciplinary therapy in a comprehensive inpatient rehab setting. Physiatrist is providing close team supervision and 24 hour management of active medical problems listed below. Physiatrist and rehab team continue to assess barriers to discharge/monitor patient progress toward functional and medical goals. FIM: FIM - Bathing Bathing Steps Patient Completed: Chest;Right Arm;Left Arm;Abdomen;Right upper leg;Left upper leg;Front perineal area;Buttocks Bathing: 4: Min-Patient completes 8-9 72f 10 parts or 75+ percent  FIM - Upper Body Dressing/Undressing Upper body dressing/undressing steps patient completed: Thread/unthread right sleeve of pullover shirt/dresss;Thread/unthread left sleeve of pullover shirt/dress;Put head through opening of pull over shirt/dress;Pull shirt over trunk Upper body dressing/undressing: 4: Steadying assist FIM - Lower Body Dressing/Undressing Lower body dressing/undressing steps patient completed: Thread/unthread left underwear leg;Pull underwear up/down;Thread/unthread left pants leg;Pull pants up/down;Don/Doff right sock;Don/Doff left sock Lower body dressing/undressing: 4: Min-Patient completed 75 plus % of tasks  FIM - Toileting Toileting steps completed by patient: Performs perineal hygiene Toileting Assistive Devices: Grab bar or rail for support Toileting: 2: Max-Patient completed 1 of 3 steps  FIM - Diplomatic Services operational officer Devices: Grab bars Toilet  Transfers: 3-To toilet/BSC: Mod A (lift or lower assist);3-From toilet/BSC: Mod A (lift or lower assist)  FIM - Bed/Chair Transfer Bed/Chair Transfer Assistive Devices: Bed rails Bed/Chair Transfer: 4: Sit > Supine: Min A (steadying pt. > 75%/lift 1 leg);4: Bed > Chair or W/C: Min A (steadying Pt. > 75%)  FIM - Locomotion: Wheelchair Distance: 150 Locomotion: Wheelchair: 4: Travels 150 ft or more: maneuvers on rugs and over door sillls with minimal assistance (Pt.>75%) FIM - Locomotion: Ambulation Locomotion: Ambulation Assistive Devices: Walker - Rolling (with rt  hand splint) Ambulation/Gait Assistance: 4: Min assist Locomotion: Ambulation: 2: Travels 50 - 149 ft with minimal assistance (Pt.>75%)  Comprehension Comprehension Mode: Auditory Comprehension: 6-Follows complex conversation/direction: With extra time/assistive device  Expression Expression Mode: Verbal Expression: 5-Expresses complex 90% of the time/cues < 10% of the time  Social Interaction Social Interaction: 6-Interacts appropriately with others with medication or extra time (anti-anxiety, antidepressant).  Problem Solving Problem Solving: 5-Solves basic 90% of the time/requires cueing < 10% of the time  Memory Memory: 5-Recognizes or recalls 90% of the time/requires cueing < 10% of the time  Medical Problem List and Plan:  1. DVT Prophylaxis/Anticoagulation: Pharmaceutical: Lovenox  2. Pain Management: Tylenol as needed .  Flexor withdrawal spasms improved on klonopin  3. Mood: No evidence of depression or emotional lability. Will monitor.  4. Neuropsych: This patient Is capable of making decisions on his/her own behalf.  5. Diabetes continue Lantus as well as sliding scale will adjust as needed. Increase lantus to 40 Units BID,metformin restarted as well this week.Monitor 6. Hypertension by history currently not on medications will monitor 7.  No symptoms of GERD d/c zantac 8.  Hx hypomag/FEN:  f/u serum  level  -encourage fluids.   -recheck lytes, mg++ ok 9.  Loose inc stools, will stop antacid, may need to stop metformin  LOS (Days) 5 A FACE TO FACE EVALUATION WAS PERFORMED  Oluwasemilore Bahl E 05/07/2012, 8:28 AM

## 2012-05-07 NOTE — Progress Notes (Signed)
Occupational Therapy Note  Patient Details  Name: Larry Williams MRN: [id number] Date of Birth: [birth year] Today's Date: 05/07/2012  Group Session: Time:  1130-1145  (15 min) Pain:  none  Session focused on using RUE, safety with swallowing and small portions.  Pt. Utilized RUE as gross assist with increased time for movements.     Humberto Seals 05/07/2012, 2:00 PM

## 2012-05-07 NOTE — Progress Notes (Signed)
Occupational Therapy Session Note  Patient Details  Name: Cleo Villamizar MRN: [id number] Date of Birth: 01-21-1935  Today's Date: 05/07/2012 Time: 5784-6962 Time Calculation (min): 56 min  Short Term Goals: Week 1:  OT Short Term Goal 1 (Week 1): STG = LTGs due to short ELOS  Skilled Therapeutic Interventions/Progress Updates:    Pt seen for ADL retraining with focus on functional mobility with RW to obtain clothing items, RUE use with dressing and grooming tasks, and NM re-ed in RUE in unsupported sitting.  Educated pt on hand placement with sit to stand, to push up from bed and not pull up on RW to increase safety.  Pt ambulated with min assist with RW to gather clothing items.  Pt with difficulty tying shoe, issued shoe buttons and had pt return demonstrate hooking and unhooking shoe laces from shoe buttons.  Pt's wife present and explained shoe buttons to her as well, she reports he had difficulty donning and tying shoes prior to stroke.  Engaged in NM re-ed in therapy gym in unsupported sitting with focus on gross and fine motor control with RUE.  Completed 9 hole peg test with Lt: 42 sec and Rt: 5:35 with multiple drops of pegs.  Completed stacking cups with RUE with focus on gross and fine motor control and placing cups back in bag with BUE to incorporate both hands as in tasks similar to profession.  Therapy Documentation Precautions:  Precautions Precautions: Fall Restrictions Weight Bearing Restrictions: No Pain: Pain Assessment Pain Assessment: No/denies pain  See FIM for current functional status  Therapy/Group: Individual Therapy  Leonette Monarch 05/07/2012, 11:07 AM

## 2012-05-07 NOTE — Progress Notes (Signed)
Physical Therapy Session Note  Patient Details  Name: Larry Williams MRN: [id number] Date of Birth: Aug 21, 1935  Today's Date: 05/07/2012 Time: 1305-1400 Time Calculation (min): 55 min  Short Term Goals: Week 1:  PT Short Term Goal 1 (Week 1): Pt will perform bed<>chair transfers with S PT Short Term Goal 2 (Week 1): Pt will ambulate 100' with RW and S PT Short Term Goal 3 (Week 1): Pt will ascend/descend 4 stairs with 1 rail and Min A PT Short Term Goal 4 (Week 1): Pt will demonstrate dynamic standing balance with Min A for 5 min   Therapy Documentation Precautions:  Precautions Precautions: Fall Restrictions Weight Bearing Restrictions: No Pain: Pain Assessment Pain Assessment: No/denies pain Mobility:  Patient resting in bed; flattened bed and removed bed rail.  Patient performed rolling to R side and R side > sit with supervision and verbal cues for sequencing.  Performed stand pivot transfers bed > w/c <> mat with one UE support and mod A for lateral weight shifting and verbal cues for LE advancement and for balance when pivoting.   Balance: Standardized Balance Assessment Standardized Balance Assessment: Berg Balance Test Berg Balance Test Sit to Stand: Able to stand without using hands and stabilize independently Standing Unsupported: Able to stand 2 minutes with supervision Sitting with Back Unsupported but Feet Supported on Floor or Stool: Able to sit safely and securely 2 minutes Stand to Sit: Sits independently, has uncontrolled descent Transfers: Needs one person to assist Standing Unsupported with Eyes Closed: Able to stand 10 seconds with supervision Standing Ubsupported with Feet Together: Needs help to attain position but able to stand for 30 seconds with feet together From Standing, Reach Forward with Outstretched Arm: Can reach forward >12 cm safely (5") From Standing Position, Pick up Object from Floor: Able to pick up shoe, needs supervision From Standing  Position, Turn to Look Behind Over each Shoulder: Needs supervision when turning Turn 360 Degrees: Needs assistance while turning Standing Unsupported, Alternately Place Feet on Step/Stool: Needs assistance to keep from falling or unable to try Standing Unsupported, One Foot in Front: Needs help to step but can hold 15 seconds Standing on One Leg: Unable to try or needs assist to prevent fall Total Score: 25 Patient demonstrates increased fall risk as noted by score of  25/56 on Berg Balance Scale.  (<36= high risk for falls, close to 100%; 37-45 significant >80%; 46-51 moderate >50%; 52-55 lower >25%)  Discussed patient's falls risk with patient and wife and recommendation for use of RW; patient and wife report that patient has been having increased difficulty with dizziness and balance since a fall 5 months ago where his feet became tangled in plastic and patient fell on RUE and hit R ear/head on ground.  Patient reports having diplopia after fall and mild concussion from fall but reports that inner ear was never checked in ER.  Patient denies dizziness when rolling in bed but reports impaired balance when eyes closed.  Patient may benefit from vestibular evaluation to assess all areas of balance.    See FIM for current functional status  Therapy/Group: Individual Therapy  Edman Circle [facility] 05/07/2012, 2:13 PM

## 2012-05-07 NOTE — Progress Notes (Signed)
Physical Therapy Session Note  Patient Details  Name: Larry Williams MRN: [id number] Date of Birth: January 17, 1936  Today's Date: 05/07/2012 Time: 4098-1191 Time Calculation (min): 30 min  Short Term Goals: Week 1:  PT Short Term Goal 1 (Week 1): Pt will perform bed<>chair transfers with S PT Short Term Goal 2 (Week 1): Pt will ambulate 100' with RW and S PT Short Term Goal 3 (Week 1): Pt will ascend/descend 4 stairs with 1 rail and Min A PT Short Term Goal 4 (Week 1): Pt will demonstrate dynamic standing balance with Min A for 5 min   Skilled Therapeutic Interventions/Progress Updates:  Treatment focused on neuromuscular re-education via VCs, visual feedback, demo for R LE stance stability, R foot placement during R step taps onto 5" high step, gait x 80' with min guard assist with RW, up/down 5 steps with 2 rails, min> mod assist, mod cues for technique. Closed chain hip abduction via sidestepping required mod assist due to R hip weakness stance phase.    Therapy Documentation Precautions:  Precautions Precautions: Fall Restrictions Weight Bearing Restrictions: No Pain: Pain Assessment Pain Assessment: No/denies pain      Other Treatments: Treatments Neuromuscular Facilitation: Right;Lower Extremity;Activity to increase sustained activation;Activity to increase lateral weight shifting;Activity to increase grading;Activity to increase timing and sequencing;Activity to increase motor control;Forced use  See FIM for current functional status  Therapy/Group: Individual Therapy  Diani Jillson 05/07/2012, 3:38 PM

## 2012-05-07 NOTE — Progress Notes (Signed)
Speech Language Pathology Daily Sessions Note  Patient Details  Name: Larry Williams MRN: [id number] Date of Birth: [birth year]  Today's Date: 05/07/2012 Session 1: Time: 1145-1200 Time Calculation (min): 15 min  Session 2:  Time: 1400-1430 Time Calculation (min): 30 min  Short Term Goals: Week 1: SLP Short Term Goal 1 (Week 1): Patient will consume Dys.3 textures and nectar-thick liuqids with modified independene for use of compensatory strategies. SLP Short Term Goal 2 (Week 1): Patient will consume trials of thin liquids via cup with no ovet s/s of aspiraiton and modified indepenence to follow water protocol procedures. SLP Short Term Goal 3 (Week 1): Patient will increase speech intelligibility at the conversational level with increased wait time to slow rate of speech.    Skilled Therapeutic Interventions: Session 1: Co-treatment, group session with OT to address safety with swallowing and self-feeding. SLP facilitated session with Min asist cues to utilize slow pace of self-feeding, small portions, and to monitor right pocketing. Patient consumed Dys.3 textures and nectar-thick liquids without overt s/s of aspiration observed. Continue with current plan of care.    Session 2: Skilled treatment session focused on dysphagia goals. SLP facilitated pt with thorough oral care prior to initiating trials of thin liquid (water) via cup sips. Pt consumed 7 self-administered sips of water with supervision level cues for a slow rate with small sips. No overt s/s of aspiration observed. Recommend an additional trial of thin liquids, as pt may be appropriate for the water protocol.  FIM:  Comprehension Comprehension Mode: Auditory Comprehension: 6-Follows complex conversation/direction: With extra time/assistive device Expression Expression Mode: Verbal Expression: 6-Expresses complex ideas: With extra time/assistive device Social Interaction Social Interaction: 6-Interacts appropriately with  others with medication or extra time (anti-anxiety, antidepressant). Problem Solving Problem Solving: 5-Solves basic 90% of the time/requires cueing < 10% of the time Memory Memory: 5-Recognizes or recalls 90% of the time/requires cueing < 10% of the time FIM - Eating Eating Activity: 5: Supervision/cues  Pain Pain Assessment Pain Assessment: No/denies pain  Therapy/Group: Individual Therapy and Group Therapy  Maxcine Ham 05/07/2012, 2:45 PM

## 2012-05-08 ENCOUNTER — Inpatient Hospital Stay (HOSPITAL_COMMUNITY): Payer: Medicare Other

## 2012-05-08 ENCOUNTER — Inpatient Hospital Stay (HOSPITAL_COMMUNITY): Payer: Medicare Other | Admitting: Physical Therapy

## 2012-05-08 ENCOUNTER — Inpatient Hospital Stay (HOSPITAL_COMMUNITY): Payer: Medicare Other | Admitting: Speech Pathology

## 2012-05-08 ENCOUNTER — Inpatient Hospital Stay (HOSPITAL_COMMUNITY): Payer: Medicare Other | Admitting: Occupational Therapy

## 2012-05-08 LAB — GLUCOSE, CAPILLARY: Glucose-Capillary: 121 mg/dL — ABNORMAL HIGH (ref 70–99)

## 2012-05-08 MED ORDER — INSULIN ASPART 100 UNIT/ML ~~LOC~~ SOLN
3.0000 [IU] | Freq: Three times a day (TID) | SUBCUTANEOUS | Status: DC
  Administered 2012-05-08 – 2012-05-15 (×18): 3 [IU] via SUBCUTANEOUS

## 2012-05-08 MED ORDER — METFORMIN HCL 500 MG PO TABS
1000.0000 mg | ORAL_TABLET | Freq: Two times a day (BID) | ORAL | Status: DC
  Administered 2012-05-08 – 2012-05-15 (×15): 1000 mg via ORAL
  Filled 2012-05-08 (×17): qty 2

## 2012-05-08 NOTE — Progress Notes (Signed)
Speech Language Pathology Daily Session Note  Patient Details  Name: Larry Williams MRN: [id number] Date of Birth: 07-30-1935  Today's Date: 05/08/2012 Time: 1420-1500 Time Calculation (min): 40 min  Short Term Goals: Week 1: SLP Short Term Goal 1 (Week 1): Patient will consume Dys.3 textures and nectar-thick liuqids with modified independene for use of compensatory strategies. SLP Short Term Goal 2 (Week 1): Patient will consume trials of thin liquids via cup with no ovet s/s of aspiraiton and modified indepenence to follow water protocol procedures. SLP Short Term Goal 3 (Week 1): Patient will increase speech intelligibility at the conversational level with increased wait time to slow rate of speech.    Skilled Therapeutic Interventions: Skilled treatment session focused on addressing dysphagia and self-care goals.  SLP facilitated session with trials of regular textures and thin liquids with cough x1 during snack.  As a reuslt, SLP recommends upgrade to regular textures and thin liquids with continued full supervision for toleration and safety due to impulsivity with p.o.  Wife trained to provide full supervision and demonstrated understanding.    FIM:  Comprehension Comprehension Mode: Auditory Comprehension: 6-Follows complex conversation/direction: With extra time/assistive device Expression Expression Mode: Verbal Expression: 6-Expresses complex ideas: With extra time/assistive device Social Interaction Social Interaction: 6-Interacts appropriately with others with medication or extra time (anti-anxiety, antidepressant). Problem Solving Problem Solving: 6-Solves complex problems: With extra time Memory Memory: 6-More than reasonable amt of time FIM - Eating Eating Activity: 5: Set-up assist for open containers;5: Supervision/cues  Pain Pain Assessment Pain Assessment: No/denies pain  Therapy/Group: Individual Therapy  Charlane Ferretti.,  CCC-SLP [phone]  Doyne Ellinger 05/08/2012, 3:08 PM

## 2012-05-08 NOTE — Progress Notes (Signed)
Occupational Therapy Session Note  Patient Details  Name: Larry Williams MRN: [id number] Date of Birth: [birth year]  Today's Date: 05/08/2012 Time: 1000-1030 Time Calculation (min): 30 min  Skilled Therapeutic Interventions/Progress Updates:    Worked on RUE strengthening using UE ergonometer in sitting position.  Initially began with 2 minute intervals peddling forward with both hands on level 5 resistance.  Pt with increased difficulty maintaining grip in the left hand.  Used ace bandage to wrap the left hand in order to increase resistance on the arm and shoulder.  Resistance increased to level 10 for 3 mins and then after brief one minute rest break increased resistance to 15 and worked for another 2 mins.  After completion of that set performed 3 more sets, of 2 mins each, with use of only the RUE and level 5 resistance for peddling forward, and level 1 resistance for peddling in reverse.  Pt with much more difficulty with reverse.    Therapy Documentation Precautions:  Precautions Precautions: Fall Precaution Comments: right hemiperesis Restrictions Weight Bearing Restrictions: No Pain: Pain Assessment Pain Assessment: No/denies pain ADL: See FIM for current functional status  Therapy/Group: Individual Therapy  Nyeshia Mysliwiec OTR/L 05/08/2012, 12:18 PM

## 2012-05-08 NOTE — Progress Notes (Signed)
Speech Language Pathology Daily Session Note  Patient Details  Name: Maxen Rowland MRN: [id number] Date of Birth: 02-02-1935  Today's Date: 05/08/2012 Time: 1145-1200 Time Calculation (min): 15 min  Short Term Goals: Week 1: SLP Short Term Goal 1 (Week 1): Patient will consume Dys.3 textures and nectar-thick liuqids with modified independene for use of compensatory strategies. SLP Short Term Goal 2 (Week 1): Patient will consume trials of thin liquids via cup with no ovet s/s of aspiraiton and modified indepenence to follow water protocol procedures. SLP Short Term Goal 3 (Week 1): Patient will increase speech intelligibility at the conversational level with increased wait time to slow rate of speech.    Skilled Therapeutic Interventions: Co-treatment, group session with OT to address safety with swallowing and self-feeding. SLP facilitated session with set-up and increased wait time to utilize slow pace of self-feeding and intermittent throat clear. Patient consumed Dys.3 textures and nectar-thick liquids with no overt s/s of aspiration. Recommend to consider changing supervision level to intermittent.       FIM:  FIM - Eating Eating Activity: 5: Set-up assist for cut food  Pain Pain Assessment Pain Assessment: No/denies pain  Therapy/Group: Group Therapy  Charlane Ferretti., CCC-SLP [phone]  Abella Shugart 05/08/2012, 12:30 PM

## 2012-05-08 NOTE — Progress Notes (Signed)
Inpatient Diabetes Program Recommendations  AACE/ADA: New Consensus Statement on Inpatient Glycemic Control (2013)  Target Ranges:  Prepandial:   less than 140 mg/dL      Peak postprandial:   less than 180 mg/dL (1-2 hours)      Critically ill patients:  140 - 180 mg/dL   Results for Larry Williams, Larry Williams (MRN [id number]) as of 05/08/2012 11:54  Ref. Range 05/07/2012 07:10 05/07/2012 11:16 05/07/2012 16:30 05/07/2012 20:58 05/08/2012 07:23 05/08/2012 11:15  Glucose-Capillary Latest Range: 70-99 mg/dL 308 (H) 657 (H) 846 (H) 234 (H) 165 (H) 299 (H)    Inpatient Diabetes Program Recommendations Insulin - Basal: Please consider increasing Lantus to 42 units BID. Insulin - Meal Coverage: Please consider ordering Novolog meal coverage Novolog 3 units TID with meals.  Note: Blood glucose over the past 24 hours has ranged from 154-299 mg/dl.  Fasting blood glucose on 4/21 was 154 mg/dl and 962 mg/dl on 9/52.  Also, postprandial blood glucose consistently elevated.  Please consider increasing Lantus to 42 units BID and adding Novolog 3 units TID meal coverage as long as patient is eating at least 50% of meals.  Will continue to follow.  Thanks, Orlando Penner, RN, BSN, CCRN Diabetes Coordinator Inpatient Diabetes Program [phone]

## 2012-05-08 NOTE — Progress Notes (Signed)
Patient ID: Larry Williams, male   DOB: [birth year], 77 y.o.   MRN: [id number] Subjective/Complaints: 77 year old LH-male with history of poorly controlled DM, HTN, right hand weakness X 1 month; who was admitted to [facility] on 04/30/12 with slurred speech and right facial weakness. 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 Carotid dopplers done without ICA stenosis. 2D echo with EF 55-60% without wall abnormality. He was evaluated by Dr. Dwan Bolt (neuro) who recommended ASA as patient non complaint with this prior to admission. BSS done by ST and D3, nectar liquids recommended with strict aspiration precautions  No bowel inc, +bladder urgency  Review of Systems  Neurological: Positive for speech change and weakness.  All other systems reviewed and are negative.   Objective: Vital Signs: Blood pressure 136/76, pulse 57, temperature 97.9 F (36.6 C), temperature source Oral, resp. rate 19, weight 95.255 kg (210 lb), SpO2 97.00%. No results found. Results for orders placed during the hospital encounter of 05/02/12 (from the past 72 hour(s))  GLUCOSE, CAPILLARY     Status: Abnormal   Collection Time    05/05/12  7:55 AM      Result Value Range   Glucose-Capillary 161 (*) 70 - 99 mg/dL   Comment 1 Notify RN    GLUCOSE, CAPILLARY     Status: Abnormal   Collection Time    05/05/12 11:17 AM      Result Value Range   Glucose-Capillary 169 (*) 70 - 99 mg/dL   Comment 1 Notify RN    GLUCOSE, CAPILLARY     Status: Abnormal   Collection Time    05/05/12  4:52 PM      Result Value Range   Glucose-Capillary 207 (*) 70 - 99 mg/dL   Comment 1 Notify RN    GLUCOSE, CAPILLARY     Status: Abnormal   Collection Time    05/05/12  9:17 PM      Result Value Range   Glucose-Capillary 205 (*) 70 - 99 mg/dL   Comment 1 Notify RN    GLUCOSE, CAPILLARY     Status: Abnormal   Collection Time    05/06/12  7:45 AM      Result Value Range   Glucose-Capillary 183 (*) 70  - 99 mg/dL   Comment 1 Notify RN    GLUCOSE, CAPILLARY     Status: Abnormal   Collection Time    05/06/12 11:51 AM      Result Value Range   Glucose-Capillary 183 (*) 70 - 99 mg/dL   Comment 1 Notify RN    GLUCOSE, CAPILLARY     Status: Abnormal   Collection Time    05/06/12  5:13 PM      Result Value Range   Glucose-Capillary 204 (*) 70 - 99 mg/dL   Comment 1 Notify RN    GLUCOSE, CAPILLARY     Status: Abnormal   Collection Time    05/06/12  9:17 PM      Result Value Range   Glucose-Capillary 140 (*) 70 - 99 mg/dL  BASIC METABOLIC PANEL     Status: Abnormal   Collection Time    05/07/12  7:09 AM      Result Value Range   Sodium 138  135 - 145 mEq/L   Potassium 4.2  3.5 - 5.1 mEq/L   Chloride 100  96 - 112 mEq/L   CO2 28  19 - 32 mEq/L   Glucose, Bld 145 (*)  70 - 99 mg/dL   BUN 17  6 - 23 mg/dL   Creatinine, Ser 4.09  0.50 - 1.35 mg/dL   Calcium 9.4  8.4 - 81.1 mg/dL   GFR calc non Af Amer 87 (*) >90 mL/min   GFR calc Af Amer >90  >90 mL/min   Comment:            The eGFR has been calculated     using the CKD EPI equation.     This calculation has not been     validated in all clinical     situations.     eGFR's persistently     <90 mL/min signify     possible Chronic Kidney Disease.  MAGNESIUM     Status: None   Collection Time    05/07/12  7:09 AM      Result Value Range   Magnesium 2.0  1.5 - 2.5 mg/dL  GLUCOSE, CAPILLARY     Status: Abnormal   Collection Time    05/07/12  7:10 AM      Result Value Range   Glucose-Capillary 154 (*) 70 - 99 mg/dL   Comment 1 Notify RN    GLUCOSE, CAPILLARY     Status: Abnormal   Collection Time    05/07/12 11:16 AM      Result Value Range   Glucose-Capillary 234 (*) 70 - 99 mg/dL   Comment 1 Notify RN    GLUCOSE, CAPILLARY     Status: Abnormal   Collection Time    05/07/12  4:30 PM      Result Value Range   Glucose-Capillary 152 (*) 70 - 99 mg/dL   Comment 1 Notify RN    GLUCOSE, CAPILLARY     Status: Abnormal    Collection Time    05/07/12  8:58 PM      Result Value Range   Glucose-Capillary 234 (*) 70 - 99 mg/dL  GLUCOSE, CAPILLARY     Status: Abnormal   Collection Time    05/08/12  7:23 AM      Result Value Range   Glucose-Capillary 165 (*) 70 - 99 mg/dL   Comment 1 Notify RN        General: No acute distress  HEENT: No evidence of facial droop. Nares clear, tongue midline, no oral lesions  Heart: Regular rate and rhythm no rubs murmurs or extra sounds  Lungs clear to auscultation  Abdomen: Positive bowel sounds soft nontender palpation  Extremities: No clubbing cyanosis or edema  Skin: No evidence of skin breakdown on pressure areas  Neuro: Cranial nerves II through XII are intact  Speech is dysarthric  No evidence of aphasia  No evidence of apraxia  Mild right upper extremity ataxia finger nose to finger testing  Motor strength is 4 minus at the right deltoid, biceps, triceps, grip  Right hand finger to thumb opposition is slowed with decreased fine motor movements  Right lower extremity for/5 in the hip flexor knee extensor 3 minus/5 in the right ankle dorsiflexor and plantar flexor  Sensation intact to light touch and proprioception in both upper and lower limbs  Left-sided strength is normal   Assessment/Plan: 1. Functional deficits secondary to Left pontine infarct which require 3+ hours per day of interdisciplinary therapy in a comprehensive inpatient rehab setting. Physiatrist is providing close team supervision and 24 hour management of active medical problems listed below. Physiatrist and rehab team continue to assess barriers to discharge/monitor patient progress toward functional and  medical goals. FIM: FIM - Bathing Bathing Steps Patient Completed: Chest;Right Arm;Left Arm;Abdomen;Right upper leg;Left upper leg;Front perineal area;Buttocks Bathing: 4: Min-Patient completes 8-9 50f 10 parts or 75+ percent  FIM - Upper Body Dressing/Undressing Upper body  dressing/undressing steps patient completed: Thread/unthread right sleeve of pullover shirt/dresss;Thread/unthread left sleeve of pullover shirt/dress;Put head through opening of pull over shirt/dress;Pull shirt over trunk Upper body dressing/undressing: 5: Supervision: Safety issues/verbal cues FIM - Lower Body Dressing/Undressing Lower body dressing/undressing steps patient completed: Thread/unthread right underwear leg;Thread/unthread left underwear leg;Pull underwear up/down;Thread/unthread right pants leg;Thread/unthread left pants leg;Pull pants up/down;Don/Doff right sock;Don/Doff left sock;Don/Doff right shoe;Don/Doff left shoe Lower body dressing/undressing: 4: Min-Patient completed 75 plus % of tasks  FIM - Toileting Toileting steps completed by patient: Performs perineal hygiene Toileting Assistive Devices: Grab bar or rail for support Toileting: 2: Max-Patient completed 1 of 3 steps  FIM - Diplomatic Services operational officer Devices: Grab bars Toilet Transfers: 3-To toilet/BSC: Mod A (lift or lower assist);3-From toilet/BSC: Mod A (lift or lower assist)  FIM - Bed/Chair Transfer Bed/Chair Transfer Assistive Devices: Therapist, occupational: 5: Supine > Sit: Supervision (verbal cues/safety issues);3: Bed > Chair or W/C: Mod A (lift or lower assist);3: Chair or W/C > Bed: Mod A (lift or lower assist)  FIM - Locomotion: Wheelchair Distance: 150 Locomotion: Wheelchair: 1: Total Assistance/staff pushes wheelchair (Pt<25%) FIM - Locomotion: Ambulation Locomotion: Ambulation Assistive Devices: Walker - Rolling (with rt hand splint) Ambulation/Gait Assistance: 4: Min assist Locomotion: Ambulation: 0: Activity did not occur  Comprehension Comprehension Mode: Auditory Comprehension: 6-Follows complex conversation/direction: With extra time/assistive device  Expression Expression Mode: Verbal Expression: 6-Expresses complex ideas: With extra time/assistive device  Social  Interaction Social Interaction: 6-Interacts appropriately with others with medication or extra time (anti-anxiety, antidepressant).  Problem Solving Problem Solving: 6-Solves complex problems: With extra time  Memory Memory: 6-More than reasonable amt of time  Medical Problem List and Plan:  1. DVT Prophylaxis/Anticoagulation: Pharmaceutical: Lovenox  2. Pain Management: Tylenol as needed .  Flexor withdrawal spasms improved on klonopin  3. Mood: No evidence of depression or emotional lability. Will monitor.  4. Neuropsych: This patient Is capable of making decisions on his/her own behalf.  5. Diabetes continue Lantus as well as sliding scale will adjust as needed. Increase lantus to 40 Units BID,metformin restarted as well this week.Monitor 6. Hypertension by history currently not on medications will monitor 7.  No symptoms of GERD d/c zantac 8.  Hx hypomag/FEN:  f/u serum level  -encourage fluids.   -recheck lytes, mg++ ok 9.  Loose inc stools, stopped antacid,no recurrence monitor LOS (Days) 6 A FACE TO FACE EVALUATION WAS PERFORMED  KIRSTEINS,ANDREW E 05/08/2012, 7:53 AM

## 2012-05-08 NOTE — Progress Notes (Signed)
Occupational Therapy Session Note  Patient Details  Name: Larry Williams MRN: [id number] Date of Birth: [birth year]  Today's Date: 05/08/2012 Time: 0918-1000 Time Calculation (min): 42 min  Short Term Goals: Week 1:  OT Short Term Goal 1 (Week 1): STG = LTGs due to short ELOS  Skilled Therapeutic Interventions/Progress Updates:    Pt seen for ADL retraining with focus on safety with functional mobility and transfers with RW.  Engaged in bathing at sit to stand level in walk-in shower.  Pt required min assist with shower transfer secondary to RW placement and cues for technique.  Pt overall supervision with bathing and dressing this session, requiring increased time with shoes and fastening shoe button.  Pt completed grooming in standing with increased functional use of RUE with obtaining and opening items.  Therapy Documentation Precautions:  Precautions Precautions: Fall Precaution Comments: right hemiperesis Restrictions Weight Bearing Restrictions: No Pain: Pain Assessment Pain Assessment: No/denies pain Pain Score: 0-No pain  See FIM for current functional status  Therapy/Group: Individual Therapy  Leonette Monarch 05/08/2012, 10:02 AM

## 2012-05-08 NOTE — Progress Notes (Signed)
Occupational Therapy Note  Patient Details  Name: Larry Williams MRN: [id number] Date of Birth: [birth year] Today's Date: 05/08/2012  Time: 1130-1145 (cotx with Speech therapy-total time 1130-1200) Pt denies pain Group Therapy  Pt participated in self feeding group with focus on increased use of RUE for setup and self feeding, portion control, and swallowing strategies.  Pt required verbal cues to initiate use of RUE and hand-over-hand to assist with opening containers.     Lavone Neri [facility] 05/08/2012, 2:55 PM

## 2012-05-08 NOTE — Progress Notes (Signed)
Physical Therapy Session Note/Vestibular Evaluation  Patient Details  Name: Larry Williams MRN: [id number] Date of Birth: [birth year]  Today's Date: 05/08/2012 Time: 0806-0900 Time Calculation (min): 54 min  Short Term Goals: Week 1:  PT Short Term Goal 1 (Week 1): Pt will perform bed<>chair transfers with S PT Short Term Goal 2 (Week 1): Pt will ambulate 100' with RW and S PT Short Term Goal 3 (Week 1): Pt will ascend/descend 4 stairs with 1 rail and Min A PT Short Term Goal 4 (Week 1): Pt will demonstrate dynamic standing balance with Min A for 5 min   Skilled Therapeutic Interventions/Progress Updates:    Vestibular Evaluation Complete: occulomotor testing WNL, head shaking test mildly symptomatic horizontal>vertical.  Head thrust test (-) bil.  Dix Hallpike (-) bil.  Log roll test with head at 30 degrees (-) bil.  No nystagmus or saccades noted throughout testing.  Issued x1 exercises and practiced in sitting and standing (sittig was too easy). Standing required min assist especially with vertical head shakes.  Recommend continued work on balance EO/EC and both vertical and horizontal gaze stability x1 exercises in standing.     Tx: Bed mobility min assist with no rails from flat bed.  Sit to stand and stand pivot into and out of WC with RW  Min assist.  Gait with RW 75' x 2 with min assist seated rest break between gait due to right leg fatigue. Verbal cues and demonstration of why he needs to stay closer to RW.  Balance practice outside of the parallel bars: min assist feet together, semi tandem stand bil multiple trials.  Stairs up and down 4-6" steps min assist bil rails.  Pt able to walk up/down stairs verbalizing and demonstrating correct/safest LE sequence with min assist.  WC mobility 75 with bil legs only to work on LE strength and endurance supervision.    Therapy Documentation Precautions:  Precautions Precautions: Fall Precaution Comments: right hemiperesis Restrictions Weight  Bearing Restrictions: No   Pain: Pain Assessment Pain Assessment: No/denies pain Pain Score: 0-No pain   Locomotion : Ambulation Ambulation/Gait Assistance: 4: Min assist Wheelchair Mobility Distance: 75   See FIM for current functional status  Therapy/Group: Individual Therapy  Lurena Joiner B. Krystan Northrop, PT, DPT [phone]   05/08/2012, 9:07 AM

## 2012-05-09 ENCOUNTER — Inpatient Hospital Stay (HOSPITAL_COMMUNITY): Payer: Medicare Other | Admitting: Speech Pathology

## 2012-05-09 ENCOUNTER — Inpatient Hospital Stay (HOSPITAL_COMMUNITY): Payer: Medicare Other

## 2012-05-09 ENCOUNTER — Inpatient Hospital Stay (HOSPITAL_COMMUNITY): Payer: Medicare Other | Admitting: Physical Therapy

## 2012-05-09 ENCOUNTER — Inpatient Hospital Stay (HOSPITAL_COMMUNITY): Payer: Medicare Other | Admitting: Occupational Therapy

## 2012-05-09 LAB — GLUCOSE, CAPILLARY
Glucose-Capillary: 101 mg/dL — ABNORMAL HIGH (ref 70–99)
Glucose-Capillary: 162 mg/dL — ABNORMAL HIGH (ref 70–99)
Glucose-Capillary: 87 mg/dL (ref 70–99)

## 2012-05-09 MED ORDER — DICLOFENAC SODIUM 1 % TD GEL
2.0000 g | Freq: Four times a day (QID) | TRANSDERMAL | Status: DC
  Administered 2012-05-09 – 2012-05-15 (×25): 2 g via TOPICAL
  Filled 2012-05-09: qty 100

## 2012-05-09 NOTE — Progress Notes (Signed)
Occupational Therapy Session Note  Patient Details  Name: Larry Williams MRN: [id number] Date of Birth: [birth year]  Today's Date: 05/09/2012 Time: 6578-4696 Time Calculation (min): 61 min  Short Term Goals: Week 1:  OT Short Term Goal 1 (Week 1): STG = LTGs due to short ELOS  Skilled Therapeutic Interventions/Progress Updates:    Pt declined wanting to shower this am and decided just to work on grooming tasks at the sink and UB bathing.  Pt stood for all grooming activities including shaving, brushing his teeth, washing his hands and combing his hair.  He was able to maintain standing for 30 mins without rest and only close supervision for balance.  Able to also incorporate use of the right hand as an active assist for opening items and applying shaving cream.  Also performed toilet transfer, ambulating to the bathroom with min assist and no device.  Pt with decreased ability to maintain stance phase on the RLE for adequate step length.  Pt issued a right wrist cock-up splint by MD earlier today secondary to having pain at times in the wrist and forearm and some sensory issues in the digits.  Pt still with decreased FM capabilities and overall strength in the RUE.  Therapy Documentation Precautions:  Precautions Precautions: Fall Precaution Comments: right hemiperesis Restrictions Weight Bearing Restrictions: No  Pain: Pain Assessment Pain Assessment: No/denies pain ADL: See FIM for current functional status  Therapy/Group: Individual Therapy  [name] OTR/L 05/09/2012, 10:51 AM

## 2012-05-09 NOTE — Progress Notes (Signed)
Physical Therapy Note  Patient Details  Name: Kieth Hartis MRN: [id number] Date of Birth: [birth year] Today's Date: 05/09/2012  3:00 - 3:45 45 minutes Individual session Patient denies pain.  Patient sitting in recliner upon entering room. Patient sit to stand with supervision to rolling walker. Patient ambulated 150 feet to gym with min assist for balance. Patient worked on stepping to targets with right and left LE's on level surface and on 4 inch steps with min assist. Patient ambulated around obstacles without assistive device and min to mod assist for balance. Patient ambulated on foam mat 35 feet with rolling walker and right hand splint and min assist. Patient ambulated 150 feet on level tile back to room with RW and was left sitting edge of bed with items in reach and wife's supervision.   Arelia Longest M 05/09/2012, 4:04 PM

## 2012-05-09 NOTE — Progress Notes (Signed)
Physical Therapy Session Note  Patient Details  Name: Larry Williams MRN: [id number] Date of Birth: January 27, 1935  Today's Date: 05/09/2012 Time: 4098-1191 Time Calculation (min): 54 min  Short Term Goals: Week 1:  PT Short Term Goal 1 (Week 1): Pt will perform bed<>chair transfers with S PT Short Term Goal 2 (Week 1): Pt will ambulate 100' with RW and S PT Short Term Goal 3 (Week 1): Pt will ascend/descend 4 stairs with 1 rail and Min A PT Short Term Goal 4 (Week 1): Pt will demonstrate dynamic standing balance with Min A for 5 min   Skilled Therapeutic Interventions/Progress Updates:    This session focused on Gait with RW.  Pt has new right wrist splint and we had to make a new strap for his hand attachment to the RW due to the previous strap was too short when he is wearing the wrist splint.  Gait >150' with RW min assist with verbal cues to stay closer to RW.  Car transfer min assist with verbal cues for safe technique (pt did it twice once with one foot step in and once backing up-safer).  Explained why backing up may be the safer method now.  Stairs with bil rails min assist.  Verbal cues for leg sequencing.  Step to pattern 4-6" step height.  Standing exercises: hip abduction, standing marches, knee flexion, heel and toe raises x 10 each bil holding to parallel bars.  Side stepping, forward marching, and retro gait holing with one hand on parallel bar x 10' x 2 each.  NuStep level 7 x 10 mins with hand strap attachment and bil upper and lower extremities.    Therapy Documentation Precautions:  Precautions Precautions: Fall Precaution Comments: right hemiperesis Restrictions Weight Bearing Restrictions: No   Pain: Pain Assessment Pain Assessment: No/denies pain Pain Score: 0-No pain   Locomotion : Ambulation Ambulation/Gait Assistance: 4: Min assist   See FIM for current functional status  Therapy/Group: Individual Therapy  Lurena Joiner B. Shanavia Makela, PT, DPT  [phone]   05/09/2012, 3:00 PM

## 2012-05-09 NOTE — Progress Notes (Signed)
Speech Language Pathology Daily Session Note  Patient Details  Name: Larry Williams MRN: [id number] Date of Birth: [birth year]  Today's Date: 05/09/2012 Time: 1130-1215 Time Calculation (min): 45 min  Short Term Goals: Week 1: SLP Short Term Goal 1 (Week 1): Patient will consume Dys.3 textures and nectar-thick liuqids with modified independene for use of compensatory strategies. SLP Short Term Goal 2 (Week 1): Patient will consume trials of thin liquids via cup with no ovet s/s of aspiraiton and modified indepenence to follow water protocol procedures. SLP Short Term Goal 3 (Week 1): Patient will increase speech intelligibility at the conversational level with increased wait time to slow rate of speech.    Skilled Therapeutic Interventions: Treatment focus on dysphagia goals. SLP facilitated session with set-up and increased wait time to utilize slow pace of self-feeding and intermittent throat clear. Patient consumed upgraded regular textures and thin liquids with overt cough X 2, suspect due to mixed consistencies. Pt re-educated on current swallowing compensatory strategies and pt verbalized understanding.    FIM:  Comprehension Comprehension Mode: Auditory Comprehension: 6-Follows complex conversation/direction: With extra time/assistive device Expression Expression Mode: Verbal Expression: 6-Expresses complex ideas: With extra time/assistive device Social Interaction Social Interaction: 6-Interacts appropriately with others with medication or extra time (anti-anxiety, antidepressant). Problem Solving Problem Solving: 6-Solves complex problems: With extra time Memory Memory: 5-Recognizes or recalls 90% of the time/requires cueing < 10% of the time FIM - Eating Eating Activity: 5: Set-up assist for open containers;5: Supervision/cues  Pain Pain Assessment Pain Assessment: No/denies pain Pain Score: 0-No pain  Therapy/Group: Individual Therapy  Krisy Dix 05/09/2012, 4:50  PM

## 2012-05-09 NOTE — Plan of Care (Signed)
Problem: Limited Adherence to Nutrition-Related Recommendations (NB-1.6) Goal: Nutrition education Formal process to instruct or train a patient/client in a skill or to impart knowledge to help patients/clients voluntarily manage or modify food choices and eating behavior to maintain or improve health. Outcome: Completed/Met Date Met:  05/09/12  RD consulted for nutrition education regarding diabetes.     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 He has followed a diabetes diet more closely in the past, but lately, has been eating whatever he wants.  RD provided "Carbohydrate Counting for People with Diabetes" handout from the Academy of [employer]. Discussed different food groups and their effects on blood sugar, emphasizing carbohydrate-containing foods. Provided list of carbohydrates and recommended serving sizes of common foods.  Discussed importance of controlled and consistent carbohydrate intake throughout the day. Provided examples of ways to balance meals/snacks and encouraged intake of high-fiber, whole grain complex carbohydrates. Teach back method used.  Expect fair compliance.  Body mass index is 31.94 kg/(m^2). Pt meets criteria for obesity, class 1 based on current BMI.  Current diet order is CHO-modified medium, patient is consuming approximately 100% of meals at this time. Labs and medications reviewed. No further nutrition interventions warranted at this time. RD contact information provided. If additional nutrition issues arise, please re-consult RD.  Joaquin Courts, RD, LDN, CNSC Pager [phone] After Hours Pager [phone]

## 2012-05-09 NOTE — Progress Notes (Signed)
Patient ID: Larry Williams, male   DOB: [birth year], 77 y.o.   MRN: [id number] Subjective/Complaints: 77 year old LH-male with history of poorly controlled DM, HTN, right hand weakness X 1 month; who was admitted to [facility] on 04/30/12 with slurred speech and right facial weakness. 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 Carotid dopplers done without ICA stenosis. 2D echo with EF 55-60% without wall abnormality. He was evaluated by Dr. Dwan Bolt (neuro) who recommended ASA as patient non complaint with this prior to admission. BSS done by ST and D3, nectar liquids recommended with strict aspiration precautions  Right hand stiffness  Review of Systems  Neurological: Positive for speech change and weakness.  All other systems reviewed and are negative.   Objective: Vital Signs: Blood pressure 129/79, pulse 57, temperature 98 F (36.7 C), temperature source Oral, resp. rate 20, weight 95.255 kg (210 lb), SpO2 97.00%. Dg Hand Complete Right  05/09/2012  *RADIOLOGY REPORT*  Clinical Data: Weakness and hand when flexing fingers for 1 month  RIGHT HAND - COMPLETE 3+ VIEW  Comparison: None  Findings: Osseous demineralization. Scattered degenerative changes of interphalangeal joints. Intercarpal and MCP joints preserved. No acute fracture, dislocation or bone destruction.  IMPRESSION: Scattered degenerative changes of interphalangeal joints. No acute abnormalities.   Original Report Authenticated By: Ulyses Southward, M.D.    Results for orders placed during the hospital encounter of 05/02/12 (from the past 72 hour(s))  GLUCOSE, CAPILLARY     Status: Abnormal   Collection Time    05/06/12 11:51 AM      Result Value Range   Glucose-Capillary 183 (*) 70 - 99 mg/dL   Comment 1 Notify RN    GLUCOSE, CAPILLARY     Status: Abnormal   Collection Time    05/06/12  5:13 PM      Result Value Range   Glucose-Capillary 204 (*) 70 - 99 mg/dL   Comment 1 Notify RN    GLUCOSE,  CAPILLARY     Status: Abnormal   Collection Time    05/06/12  9:17 PM      Result Value Range   Glucose-Capillary 140 (*) 70 - 99 mg/dL  BASIC METABOLIC PANEL     Status: Abnormal   Collection Time    05/07/12  7:09 AM      Result Value Range   Sodium 138  135 - 145 mEq/L   Potassium 4.2  3.5 - 5.1 mEq/L   Chloride 100  96 - 112 mEq/L   CO2 28  19 - 32 mEq/L   Glucose, Bld 145 (*) 70 - 99 mg/dL   BUN 17  6 - 23 mg/dL   Creatinine, Ser 4.09  0.50 - 1.35 mg/dL   Calcium 9.4  8.4 - 81.1 mg/dL   GFR calc non Af Amer 87 (*) >90 mL/min   GFR calc Af Amer >90  >90 mL/min   Comment:            The eGFR has been calculated     using the CKD EPI equation.     This calculation has not been     validated in all clinical     situations.     eGFR's persistently     <90 mL/min signify     possible Chronic Kidney Disease.  MAGNESIUM     Status: None   Collection Time    05/07/12  7:09 AM      Result Value Range  Magnesium 2.0  1.5 - 2.5 mg/dL  GLUCOSE, CAPILLARY     Status: Abnormal   Collection Time    05/07/12  7:10 AM      Result Value Range   Glucose-Capillary 154 (*) 70 - 99 mg/dL   Comment 1 Notify RN    GLUCOSE, CAPILLARY     Status: Abnormal   Collection Time    05/07/12 11:16 AM      Result Value Range   Glucose-Capillary 234 (*) 70 - 99 mg/dL   Comment 1 Notify RN    GLUCOSE, CAPILLARY     Status: Abnormal   Collection Time    05/07/12  4:30 PM      Result Value Range   Glucose-Capillary 152 (*) 70 - 99 mg/dL   Comment 1 Notify RN    GLUCOSE, CAPILLARY     Status: Abnormal   Collection Time    05/07/12  8:58 PM      Result Value Range   Glucose-Capillary 234 (*) 70 - 99 mg/dL  GLUCOSE, CAPILLARY     Status: Abnormal   Collection Time    05/08/12  7:23 AM      Result Value Range   Glucose-Capillary 165 (*) 70 - 99 mg/dL   Comment 1 Notify RN    GLUCOSE, CAPILLARY     Status: Abnormal   Collection Time    05/08/12 11:15 AM      Result Value Range    Glucose-Capillary 299 (*) 70 - 99 mg/dL   Comment 1 Notify RN    GLUCOSE, CAPILLARY     Status: Abnormal   Collection Time    05/08/12  4:28 PM      Result Value Range   Glucose-Capillary 165 (*) 70 - 99 mg/dL   Comment 1 Notify RN    GLUCOSE, CAPILLARY     Status: Abnormal   Collection Time    05/08/12  8:38 PM      Result Value Range   Glucose-Capillary 121 (*) 70 - 99 mg/dL   Comment 1 Notify RN    CREATININE, SERUM     Status: Abnormal   Collection Time    05/09/12  5:21 AM      Result Value Range   Creatinine, Ser 0.84  0.50 - 1.35 mg/dL   GFR calc non Af Amer 84 (*) >90 mL/min   GFR calc Af Amer >90  >90 mL/min   Comment:            The eGFR has been calculated     using the CKD EPI equation.     This calculation has not been     validated in all clinical     situations.     eGFR's persistently     <90 mL/min signify     possible Chronic Kidney Disease.  GLUCOSE, CAPILLARY     Status: Abnormal   Collection Time    05/09/12  7:11 AM      Result Value Range   Glucose-Capillary 101 (*) 70 - 99 mg/dL      General: No acute distress  HEENT: No evidence of facial droop. Nares clear, tongue midline, no oral lesions  Heart: Regular rate and rhythm no rubs murmurs or extra sounds  Lungs clear to auscultation  Abdomen: Positive bowel sounds soft nontender palpation  Extremities: No clubbing cyanosis or edema  Skin: No evidence of skin breakdown on pressure areas  Neuro: Cranial nerves II through XII are intact  Speech is dysarthric  No evidence of aphasia  No evidence of apraxia  Mild right upper extremity ataxia finger nose to finger testing  Motor strength is 4 minus at the right deltoid, biceps, triceps, grip  Right hand finger to thumb opposition is slowed with decreased fine motor movements  Right lower extremity 4/5 in the hip flexor knee extensor 3 minus/5 in the right ankle dorsiflexor and plantar flexor  Sensation intact to light touch and proprioception in  both upper and lower limbs  Left-sided strength is normal   Assessment/Plan: 1. Functional deficits secondary to Left pontine infarct which require 3+ hours per day of interdisciplinary therapy in a comprehensive inpatient rehab setting. Physiatrist is providing close team supervision and 24 hour management of active medical problems listed below. Physiatrist and rehab team continue to assess barriers to discharge/monitor patient progress toward functional and medical goals. FIM: FIM - Bathing Bathing Steps Patient Completed: Chest;Right Arm;Left Arm;Abdomen;Front perineal area;Right upper leg;Left upper leg Bathing: 3: Mod-Patient completes 5-7 42f 10 parts or 50-74%  FIM - Upper Body Dressing/Undressing Upper body dressing/undressing steps patient completed: Thread/unthread right sleeve of pullover shirt/dresss;Thread/unthread left sleeve of pullover shirt/dress;Put head through opening of pull over shirt/dress;Pull shirt over trunk Upper body dressing/undressing: 5: Supervision: Safety issues/verbal cues FIM - Lower Body Dressing/Undressing Lower body dressing/undressing steps patient completed: Thread/unthread right underwear leg;Thread/unthread left underwear leg;Pull underwear up/down;Thread/unthread right pants leg;Thread/unthread left pants leg;Pull pants up/down;Don/Doff right sock;Don/Doff left sock;Don/Doff right shoe;Don/Doff left shoe;Fasten/unfasten right shoe;Fasten/unfasten left shoe Lower body dressing/undressing: 4: Steadying Assist  FIM - Toileting Toileting steps completed by patient: Performs perineal hygiene Toileting Assistive Devices: Grab bar or rail for support Toileting: 2: Max-Patient completed 1 of 3 steps  FIM - Diplomatic Services operational officer Devices: Grab bars Toilet Transfers: 3-To toilet/BSC: Mod A (lift or lower assist);3-From toilet/BSC: Mod A (lift or lower assist)  FIM - Bed/Chair Transfer Bed/Chair Transfer Assistive Devices:  Therapist, occupational: 4: Supine > Sit: Min A (steadying Pt. > 75%/lift 1 leg);5: Sit > Supine: Supervision (verbal cues/safety issues);4: Bed > Chair or W/C: Min A (steadying Pt. > 75%);4: Chair or W/C > Bed: Min A (steadying Pt. > 75%)  FIM - Locomotion: Wheelchair Distance: 75 Locomotion: Wheelchair: 2: Travels 50 - 149 ft with supervision, cueing or coaxing FIM - Locomotion: Ambulation Locomotion: Ambulation Assistive Devices: Designer, industrial/product Ambulation/Gait Assistance: 4: Min assist Locomotion: Ambulation: 2: Travels 50 - 149 ft with minimal assistance (Pt.>75%)  Comprehension Comprehension Mode: Auditory Comprehension: 6-Follows complex conversation/direction: With extra time/assistive device  Expression Expression Mode: Verbal Expression: 6-Expresses complex ideas: With extra time/assistive device  Social Interaction Social Interaction: 6-Interacts appropriately with others with medication or extra time (anti-anxiety, antidepressant).  Problem Solving Problem Solving: 6-Solves complex problems: With extra time  Memory Memory: 6-More than reasonable amt of time  Medical Problem List and Plan:  1. DVT Prophylaxis/Anticoagulation: Pharmaceutical: Lovenox  2. Pain Management: Tylenol as needed .  Flexor withdrawal spasms improved on klonopin  3. Mood: No evidence of depression or emotional lability. Will monitor.  4. Neuropsych: This patient Is capable of making decisions on his/her own behalf.  5. Diabetes Now Controlled continue Lantus as well as sliding scale will adjust as needed. Increase lantus to 40 Units BID,metformin restarted as well this week.Monitor 6. Hypertension by history currently not on medications will monitor 7.  No symptoms of GERD d/c zantac 8.  Hx hypomag/FEN:  f/u serum level  -encourage fluids.   -recheck lytes, mg++  ok 9.  Loose inc stools, stopped antacid,no recurrence monitor 10.  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

## 2012-05-09 NOTE — Progress Notes (Signed)
Orthopedic Tech Progress Note Patient Details:  Larry Williams 12/13/1935 [id number]  Ortho Devices Type of Ortho Device: Wrist splint Ortho Device/Splint Interventions: Application   Cammer, Mickie Bail 05/09/2012, 9:33 AM

## 2012-05-10 ENCOUNTER — Inpatient Hospital Stay (HOSPITAL_COMMUNITY): Payer: Medicare Other | Admitting: Occupational Therapy

## 2012-05-10 ENCOUNTER — Inpatient Hospital Stay (HOSPITAL_COMMUNITY): Payer: Medicare Other

## 2012-05-10 ENCOUNTER — Inpatient Hospital Stay (HOSPITAL_COMMUNITY): Payer: Medicare Other | Admitting: Physical Therapy

## 2012-05-10 LAB — GLUCOSE, CAPILLARY
Glucose-Capillary: 89 mg/dL (ref 70–99)
Glucose-Capillary: 165 mg/dL — ABNORMAL HIGH (ref 70–99)

## 2012-05-10 NOTE — Plan of Care (Signed)
At 1655, patient's blood sugar was 65. Asymptomatic. Patient explained no snack after lunch today and has had blood sugar drop in the past. Milk given. Marissa Nestle, PA notified. Blood sugar rechecked: 89. 3 units meal coverage insulin held, per orders from [employer], PA. Sherlyn Lees, RN

## 2012-05-10 NOTE — Progress Notes (Signed)
Occupational Therapy Session Note  Patient Details  Name: Larry Williams MRN: [id number] Date of Birth: 1935-08-18  Today's Date: 05/10/2012 Time: 1200-1215 Time Calculation (min): 15 min   Skilled Therapeutic Interventions/Progress Updates: Patient participated in [employer] today to address his safety with managing solids and liquids in a more social setting. Patient also with goal to incorporate right upper extremity more into meal time tasks (e.g. Cutting meat, as patient is left hand dominant.) Patient attempted X3 to utilize right hand to hold fork while cutting [place] with limited success. Patient required intermittent verbal cueing to safely manage swallow.   Therapy Documentation  Precautions:  Precautions  Precautions: Fall  Restrictions  Weight Bearing Restrictions: No   Pain:  Pain Assessment  Pain Assessment: No/denies pain  Pain Score: 0-No pain  See FIM for current functional status   Therapy/Group: Group Therapy  Larry Williams 05/10/2012, 12:58 PM

## 2012-05-10 NOTE — Progress Notes (Addendum)
Physical Therapy Weekly Progress Note  Patient Details  Name: Larry Williams MRN: [id number] Date of Birth: Aug 07, 1935  Today's Date: 05/10/2012 Time: Session #1:  [id number], Session #2: [id number] Time Calculation (min):Session #1:  56 min, Session #2:23 min   Patient has met 3 of 4 short term goals.  The 4th goal was not tested.    Patient continues to demonstrate the following deficits: decreased right leg strength, decreased balance, decreased ability to ambulate without assistive device, decreased activity tolerance and therefore will continue to benefit from skilled PT intervention to enhance overall performance with activity tolerance, balance and ability to compensate for deficits.  Patient progressing toward long term goals..  Continue plan of care. Progress towards LTGs  PT Short Term Goals Week 1:  PT Short Term Goal 1 (Week 1): Pt will perform bed<>chair transfers with S PT Short Term Goal 1 - Progress (Week 1): Met PT Short Term Goal 2 (Week 1): Pt will ambulate 100' with RW and S PT Short Term Goal 2 - Progress (Week 1): Met PT Short Term Goal 3 (Week 1): Pt will ascend/descend 4 stairs with 1 rail and Min A PT Short Term Goal 3 - Progress (Week 1): Met PT Short Term Goal 4 (Week 1): Pt will demonstrate dynamic standing balance with Min A for 5 min  PT Short Term Goal 4 - Progress (Week 1): Other (comment) (not tested)  Skilled Therapeutic Interventions/Progress Updates:    Session #1: Gait with RW and right hand splint supervision >150' x 2.  Less cueing needed today for staying closer to RW.  More confident steps and increased right leg stability noted with stance.  Stairs with bil rails step to pattern min assist.  Pt able to report correct leg sequencing up, but not down the stairs.  5 steps x 2 leading with right leg and then left leg to see if he had enough strength to lead with his weaker side.  Step ups right leg 4" x 10, 6" x 10 with bil railings to increase right leg  strength and control.  Mat exercises: supine SLR, bridges, hip abduction, adduction against ball, ER/abduction against blue t-band, supine marches, seated LAQ with 2# weight, seated marches with 2# weight bil.  Nu Step level 7 x 8 mins with bil arms and legs.  Right hand attached to Nustep with hand wrap attachment.     Session #2: Gait with RW supervision 150'x 2.  Pt needing less cues with gait and becoming safer.  Balance exercises in parallel bars: semi tandem ,feet together, feet apart eyes closed, cone taps, standing on foam eyes open, standing x1 exercises both vertical and horizontal.  All exercises with min assist bil legs, multiple trials.    Therapy Documentation Precautions:  Precautions Precautions: Fall Precaution Comments: right hemiperesis Restrictions Weight Bearing Restrictions: No General:   Vital Signs: Therapy Vitals Temp: 98 F (36.7 C) Temp src: Oral Pulse Rate: 54 Resp: 20 BP: 143/78 mmHg Oxygen Therapy SpO2: 97 % O2 Device: None (Room air) Pain: Pain Assessment Pain Assessment: No/denies pain Pain Score: 0-No pain  See FIM for current functional status  Therapy/Group: Individual Therapy  Lurena Joiner B. Deonta Bomberger, PT, DPT [phone]   05/10/2012, 9:01 AM

## 2012-05-10 NOTE — Progress Notes (Signed)
Social Work Patient ID: Larry Williams, male   DOB: 1935/03/01, 77 y.o.   MRN: [id number]  Met yesterday afternoon with pt to review team conference.  Aware and agreeable with targeted d/c date of 4/29 with supervision goals.  Discussion of follow up therapies, however, pt uncertain of their plans regarding return to their home in [place].  Will follow up further tomorrow with pt and wife to determine where therapy f/u should be arranged.  Vincient Vanaman, LCSW

## 2012-05-10 NOTE — Plan of Care (Signed)
Hypoglycemic Event  CBG: 65  Treatment: 15 GM carbohydrate snack  Symptoms: None  Follow-up CBG: Time: 1710 CBG Result: 89  Possible Reasons for Event: Unknown  Comments/MD notified: Marissa Nestle, PA notified - orders to give milk and recheck blood sugar    Sherlyn Lees  Remember to initiate Hypoglycemia Order Set & complete

## 2012-05-10 NOTE — Progress Notes (Signed)
Speech Language Pathology Weekly Progress Note  Patient Details  Name: [name] MRN: [id number] Date of Birth: 1935/07/05  Today's Date: 05/10/2012  Short Term Goals: Week 1: SLP Short Term Goal 1 (Week 1): Patient will consume Dys.3 textures and nectar-thick liuqids with modified independene for use of compensatory strategies. SLP Short Term Goal 1 - Progress (Week 1): Met SLP Short Term Goal 2 (Week 1): Patient will consume trials of thin liquids via cup with no ovet s/s of aspiraiton and modified indepenence to follow water protocol procedures. SLP Short Term Goal 2 - Progress (Week 1): Met SLP Short Term Goal 3 (Week 1): Patient will increase speech intelligibility at the conversational level with increased wait time to slow rate of speech.   SLP Short Term Goal 3 - Progress (Week 1): Met Week 2: SLP Short Term Goal 1 (Week 2): Patient will consume regular textures and thin liquids with modified independene for use of compensatory strategies: small bites, alternating solids and liquids. SLP Short Term Goal 2 (Week 2): Patient will consume trials of thin via straw with no overt s/s of aspiration.   Weekly Progress Updates: Patient met 3 out of 3 short term goals this reporting period due to gains in swallow function and speech intelligibility.  Patient's diet has been advanced from Dys.3 textures and nectar-thick liquids to regular textures and thin liquids with full supervision due to impulsivity and continued difficulty managing mixed consistencies.  As a result, this patient continues to benefit from skilled SLP services to address these deficits and maximize functional independence with least restrictive p.o. intake prior to discharge home with wife.  SLP Intensity: Minumum of 1-2 x/day, 30 to 90 minutes (in dysphagia group) SLP Frequency: 5 out of 7 days SLP Duration/Estimated Length of Stay: 1 weeek SLP Treatment/Interventions: Cueing hierarchy;Dysphagia/aspiration precaution  training;Environmental controls;Functional tasks;Internal/external aids;Patient/family education;Therapeutic Activities  Charlane Ferretti., CCC-SLP [phone]  Lemmie Vanlanen 05/10/2012, 9:02 AM

## 2012-05-10 NOTE — Progress Notes (Signed)
Patient ID: Larry Williams, male   DOB: 1935/09/24, 77 y.o.   MRN: [id number] Subjective/Complaints: 77 year old LH-male with history of poorly controlled DM, HTN, right hand weakness X 1 month; who was admitted to [facility] on 04/30/12 with slurred speech and right facial weakness. 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 Carotid dopplers done without ICA stenosis. 2D echo with EF 55-60% without wall abnormality. He was evaluated by Dr. Dwan Bolt (neuro) who recommended ASA as patient non complaint with this prior to admission. BSS done by ST and D3, nectar liquids recommended with strict aspiration precautions  Tolerating therapy, asking about D/C date  Review of Systems  Neurological: Positive for speech change and weakness.  All other systems reviewed and are negative.   Objective: Vital Signs: Blood pressure 143/78, pulse 54, temperature 98 F (36.7 C), temperature source Oral, resp. rate 20, weight 95.255 kg (210 lb), SpO2 97.00%. Dg Hand Complete Right  05/09/2012  *RADIOLOGY REPORT*  Clinical Data: Weakness and hand when flexing fingers for 1 month  RIGHT HAND - COMPLETE 3+ VIEW  Comparison: None  Findings: Osseous demineralization. Scattered degenerative changes of interphalangeal joints. Intercarpal and MCP joints preserved. No acute fracture, dislocation or bone destruction.  IMPRESSION: Scattered degenerative changes of interphalangeal joints. No acute abnormalities.   Original Report Authenticated By: Ulyses Southward, M.D.    Results for orders placed during the hospital encounter of 05/02/12 (from the past 72 hour(s))  GLUCOSE, CAPILLARY     Status: Abnormal   Collection Time    05/07/12 11:16 AM      Result Value Range   Glucose-Capillary 234 (*) 70 - 99 mg/dL   Comment 1 Notify RN    GLUCOSE, CAPILLARY     Status: Abnormal   Collection Time    05/07/12  4:30 PM      Result Value Range   Glucose-Capillary 152 (*) 70 - 99 mg/dL   Comment 1  Notify RN    GLUCOSE, CAPILLARY     Status: Abnormal   Collection Time    05/07/12  8:58 PM      Result Value Range   Glucose-Capillary 234 (*) 70 - 99 mg/dL  GLUCOSE, CAPILLARY     Status: Abnormal   Collection Time    05/08/12  7:23 AM      Result Value Range   Glucose-Capillary 165 (*) 70 - 99 mg/dL   Comment 1 Notify RN    GLUCOSE, CAPILLARY     Status: Abnormal   Collection Time    05/08/12 11:15 AM      Result Value Range   Glucose-Capillary 299 (*) 70 - 99 mg/dL   Comment 1 Notify RN    GLUCOSE, CAPILLARY     Status: Abnormal   Collection Time    05/08/12  4:28 PM      Result Value Range   Glucose-Capillary 165 (*) 70 - 99 mg/dL   Comment 1 Notify RN    GLUCOSE, CAPILLARY     Status: Abnormal   Collection Time    05/08/12  8:38 PM      Result Value Range   Glucose-Capillary 121 (*) 70 - 99 mg/dL   Comment 1 Notify RN    CREATININE, SERUM     Status: Abnormal   Collection Time    05/09/12  5:21 AM      Result Value Range   Creatinine, Ser 0.84  0.50 - 1.35 mg/dL   GFR  calc non Af Amer 84 (*) >90 mL/min   GFR calc Af Amer >90  >90 mL/min   Comment:            The eGFR has been calculated     using the CKD EPI equation.     This calculation has not been     validated in all clinical     situations.     eGFR's persistently     <90 mL/min signify     possible Chronic Kidney Disease.  GLUCOSE, CAPILLARY     Status: Abnormal   Collection Time    05/09/12  7:11 AM      Result Value Range   Glucose-Capillary 101 (*) 70 - 99 mg/dL  GLUCOSE, CAPILLARY     Status: Abnormal   Collection Time    05/09/12 11:20 AM      Result Value Range   Glucose-Capillary 162 (*) 70 - 99 mg/dL  GLUCOSE, CAPILLARY     Status: None   Collection Time    05/09/12  4:41 PM      Result Value Range   Glucose-Capillary 87  70 - 99 mg/dL   Comment 1 Notify RN    GLUCOSE, CAPILLARY     Status: None   Collection Time    05/09/12  8:43 PM      Result Value Range   Glucose-Capillary 98   70 - 99 mg/dL   Comment 1 Notify RN    GLUCOSE, CAPILLARY     Status: None   Collection Time    05/10/12  7:18 AM      Result Value Range   Glucose-Capillary 80  70 - 99 mg/dL      General: No acute distress  HEENT: No evidence of facial droop. Nares clear, tongue midline, no oral lesions  Heart: Regular rate and rhythm no rubs murmurs or extra sounds  Lungs clear to auscultation  Abdomen: Positive bowel sounds soft nontender palpation  Extremities: No clubbing cyanosis or edema  Skin: No evidence of skin breakdown on pressure areas  Neuro: Cranial nerves II through XII are intact  Speech is dysarthric  No evidence of aphasia  No evidence of apraxia  Mild right upper extremity ataxia finger nose to finger testing  Motor strength is 4 minus at the right deltoid, biceps, triceps, grip  Right hand finger to thumb opposition is slowed with decreased fine motor movements  Right lower extremity 4/5 in the hip flexor knee extensor 3 minus/5 in the right ankle dorsiflexor and plantar flexor  Sensation intact to light touch and proprioception in both upper and lower limbs  Left-sided strength is normal   Assessment/Plan: 1. Functional deficits secondary to Left pontine infarct which require 3+ hours per day of interdisciplinary therapy in a comprehensive inpatient rehab setting. Physiatrist is providing close team supervision and 24 hour management of active medical problems listed below. Physiatrist and rehab team continue to assess barriers to discharge/monitor patient progress toward functional and medical goals. FIM: FIM - Bathing Bathing Steps Patient Completed: Chest;Right Arm;Left Arm;Abdomen Bathing: 5: Supervision: Safety issues/verbal cues  FIM - Upper Body Dressing/Undressing Upper body dressing/undressing steps patient completed: Thread/unthread right sleeve of pullover shirt/dresss;Thread/unthread left sleeve of pullover shirt/dress;Put head through opening of pull over  shirt/dress;Pull shirt over trunk Upper body dressing/undressing: 5: Supervision: Safety issues/verbal cues FIM - Lower Body Dressing/Undressing Lower body dressing/undressing steps patient completed: Thread/unthread right underwear leg;Thread/unthread left underwear leg;Pull underwear up/down;Thread/unthread right pants leg;Thread/unthread left pants leg;Pull  pants up/down;Don/Doff right sock;Don/Doff left sock;Don/Doff right shoe;Don/Doff left shoe;Fasten/unfasten right shoe;Fasten/unfasten left shoe Lower body dressing/undressing: 4: Steadying Assist  FIM - Toileting Toileting steps completed by patient: Adjust clothing prior to toileting;Performs perineal hygiene;Adjust clothing after toileting Toileting Assistive Devices: Grab bar or rail for support Toileting: 4: Steadying assist  FIM - Diplomatic Services operational officer Devices: Grab bars;Elevated toilet seat Toilet Transfers: 4-To toilet/BSC: Min A (steadying Pt. > 75%);4-From toilet/BSC: Min A (steadying Pt. > 75%)  FIM - Bed/Chair Transfer Bed/Chair Transfer Assistive Devices: Therapist, occupational: 4: Bed > Chair or W/C: Min A (steadying Pt. > 75%);4: Chair or W/C > Bed: Min A (steadying Pt. > 75%)  FIM - Locomotion: Wheelchair Distance: 75 Locomotion: Wheelchair: 0: Activity did not occur FIM - Locomotion: Ambulation Locomotion: Ambulation Assistive Devices: Walker - Rolling;Other (comment) (hand splint on right) Ambulation/Gait Assistance: 4: Min assist Locomotion: Ambulation: 4: Travels 150 ft or more with minimal assistance (Pt.>75%)  Comprehension Comprehension Mode: Auditory Comprehension: 6-Follows complex conversation/direction: With extra time/assistive device  Expression Expression Mode: Verbal Expression: 6-Expresses complex ideas: With extra time/assistive device  Social Interaction Social Interaction: 6-Interacts appropriately with others with medication or extra time (anti-anxiety,  antidepressant).  Problem Solving Problem Solving: 6-Solves complex problems: With extra time  Memory Memory: 6-More than reasonable amt of time  Medical Problem List and Plan:  1. DVT Prophylaxis/Anticoagulation: Pharmaceutical: Lovenox  2. Pain Management: Tylenol as needed .  Flexor withdrawal spasms improved on klonopin  3. Mood: No evidence of depression or emotional lability. Will monitor.  4. Neuropsych: This patient Is capable of making decisions on his/her own behalf.  5. Diabetes Now Controlled continue Lantus as well as sliding scale will adjust as needed. Increase lantus to 40 Units BID,metformin restarted as well this week.Monitor 6. Hypertension by history currently not on medications will monitor 7.  No symptoms of GERD d/c zantac 8.  Hx hypomag/FEN:  f/u serum level  -encourage fluids.   -recheck lytes, mg++ ok 9.  Loose inc stools, stopped antacid,no recurrence monitor 10.  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

## 2012-05-10 NOTE — Progress Notes (Signed)
Speech Language Pathology Daily Session Note  Patient Details  Name: Larry Williams MRN: [id number] Date of Birth: 08/26/1935  Today's Date: 05/10/2012 Time: 1130-1145 Time Calculation (min): 15 min  Short Term Goals: Week 2: SLP Short Term Goal 1 (Week 2): Patient will consume regular textures and thin liquids with modified independene for use of compensatory strategies: small bites, alternating solids and liquids. SLP Short Term Goal 2 (Week 2): Patient will consume trials of thin via straw with no overt s/s of aspiration.   Skilled Therapeutic Interventions: Patient participated in [employer] today to address dysphagia goals and self-feeding. Patient required intermittent verbal cueing for utilization of swallowing compensatory strategies and demonstrated one overt episode of aspiration throughout the meal.    FIM:  FIM - Eating Eating Activity: 5: Supervision/cues;5: Set-up assist for open containers  Pain Pain Assessment Pain Assessment: No/denies pain  Therapy/Group: Group Therapy  Clester Chlebowski 05/10/2012, 5:02 PM

## 2012-05-10 NOTE — Patient Care Conference (Signed)
Inpatient RehabilitationTeam Conference and Plan of Care Update Date: 05/09/2012   Time: 11:25 AM    Patient Name: Larry Williams      Medical Record Number: [id number]  Date of Birth: 02/03/1935 Sex: Male         Room/Bed: 4147/4147-01 Payor Info: Payor: MEDICARE  Plan: MEDICARE PART A AND B  Product Type: *No Product type*     Admitting Diagnosis: R CVA  Admit Date/Time:  05/02/2012  3:03 PM Admission Comments: No comment available   Primary Diagnosis:  Left pontine CVA Principal Problem: Left pontine CVA  Patient Active Problem List   Diagnosis Date Noted  . Left pontine CVA 05/02/2012  . HTN (hypertension) 05/02/2012  . Diabetes 05/02/2012  . Hearing loss 05/02/2012    Expected Discharge Date: Expected Discharge Date: 05/15/12  Team Members Present: Physician leading conference: Dr. Claudette Laws Social Worker Present: Amada Jupiter, LCSW Nurse Present:  Kennon Portela, RN) PT Present: Wanda Plump, PT Clarisse Gouge Ripa, PT) OT Present: Leonette Monarch, OT [facility], OT) SLP Present: Fae Pippin, SLP Other (Discipline and Name): Tora Duck, PPS     Current Status/Progress Goal Weekly Team Focus  Medical   poor diabetic control , R hand pain  improve hand pain, improve DM management  see above, med management   Bowel/Bladder   pt continent of bowel and bladder with urgancy of bladder. Pt spills urinal at times due to poor grip or positioning of urinal.  remain continent of bowel and bladder      Swallow/Nutrition/ Hydration   regular textures and thin liquids with full supervision   least restrictive p.o. intake   follow for toleration 2-3 days; education    ADL's   supervision bathing, supervision UB dressing, min assist LB dressing, min assist transfers  Mod I - supervision  dynamic balance, RUE NM re-ed, functional mobility, family education   Mobility   Min-mod A  Mod I-supervision overall  Dynamic balance, gait, vestibular evaluation and exercises     Communication   Mod I   Mod I   education    Safety/Cognition/ Behavioral Observations  at baseline         Pain   no complaints of pain. Pt does complain of spasms in BLE at times. PRN robaxin in place  3 or less  monitor pain and medicate as needed   Skin   no skin breakdown, skin clean dry and intact  no new skin breakdown         *See Care Plan and progress notes for long and short-term goals.  Barriers to Discharge: limited resources post D/C    Possible Resolutions to Barriers:  maximize in hospital care    Discharge Planning/Teaching Needs:  home with family to provide 24/7 assist      Team Discussion:  DM under better control.  Anticipate reaching supervision goals (currently min assist overall).  Continue to address balance - better with rw.    Revisions to Treatment Plan:  None   Continued Need for Acute Rehabilitation Level of Care: The patient requires daily medical management by a physician with specialized training in physical medicine and rehabilitation for the following conditions: Daily direction of a multidisciplinary physical rehabilitation program to ensure safe treatment while eliciting the highest outcome that is of practical value to the patient.: Yes Daily medical management of patient stability for increased activity during participation in an intensive rehabilitation regime.: Yes Daily analysis of laboratory values and/or radiology reports with any subsequent need  for medication adjustment of medical intervention for : Neurological problems;Other  Cesare Sumlin 05/10/2012, 4:52 PM

## 2012-05-10 NOTE — Progress Notes (Signed)
Occupational Therapy Weekly Progress Note  Patient Details  Name: Larry Williams MRN: [id number] Date of Birth: 07-Feb-1935  Today's Date: 05/10/2012 Time: 4098-1191 and 1333-1400 Time Calculation (min): 45 min and 27 min  Patient is progressing towards long terms goals, no short term goals set due to short ELOS. Pt ambulating with RW at overall min assist-supervision, min assist with turns and crossing thresholds.  Pt requires increased time with LB dressing secondary to decreased sitting balance and RUE weakness.    Patient continues to demonstrate the following deficits: decreased dynamic standing balance, RUE weakness, decreased gross motor and fine motor control, activity tolerance and therefore will continue to benefit from skilled OT intervention to enhance overall performance with BADL and Reduce care partner burden.  Patient progressing toward long term goals..  Continue plan of care.  OT Short Term Goals Week 1:  OT Short Term Goal 1 (Week 1): STG = LTGs due to short ELOS OT Short Term Goal 1 - Progress (Week 1): Progressing toward goal Week 2:  OT Short Term Goal 1 (Week 2): STG = LTGs due to short ELOS  Skilled Therapeutic Interventions/Progress Updates:    1) Pt seen for ADL retraining with focus on safety with functional mobility and transfers with RW. Engaged in bathing at sit to stand level in walk-in shower. Pt with increased safety with walk-in shower transfer this session with RW.  Pt reports shower stall at home too small for shower chair, plan to attempt bathing in standing during next bathing and dressing session. Pt overall supervision with bathing and dressing this session, requiring increased time with shoes and fastening shoe button. Pt completed grooming in standing with increased functional use of RUE with obtaining and opening items.  2) Pt seen for 1:1 OT with focus on RUE gross motor and fine motor control.  Engaged in chest presses, trunk rotation, and PNF pattern  reaching with 4# therapy ball to increase overall UB strength and RUE gross motor control.  Engaged in BUE task in standing at high-low table with pipe tree with use of LUE to gather items and BUE to put together and take apart.  Pt maintained standing without physical assistance.  FMC with pegs with pt with increased grasp and in-hand manipulation this session.  Therapy Documentation Precautions:  Precautions Precautions: Fall Precaution Comments: right hemiperesis Restrictions Weight Bearing Restrictions: No Pain:  Pt with no c/o pain this session.  See FIM for current functional status  Therapy/Group: Individual Therapy  Leonette Monarch 05/10/2012, 1:01 PM

## 2012-05-11 ENCOUNTER — Inpatient Hospital Stay (HOSPITAL_COMMUNITY): Payer: Medicare Other | Admitting: Occupational Therapy

## 2012-05-11 ENCOUNTER — Inpatient Hospital Stay (HOSPITAL_COMMUNITY): Payer: Medicare Other | Admitting: Physical Therapy

## 2012-05-11 ENCOUNTER — Inpatient Hospital Stay (HOSPITAL_COMMUNITY): Payer: Medicare Other | Admitting: Speech Pathology

## 2012-05-11 LAB — GLUCOSE, CAPILLARY
Glucose-Capillary: 122 mg/dL — ABNORMAL HIGH (ref 70–99)
Glucose-Capillary: 127 mg/dL — ABNORMAL HIGH (ref 70–99)

## 2012-05-11 MED ORDER — INSULIN ASPART 100 UNIT/ML ~~LOC~~ SOLN: 0.0000 [IU] | Freq: Three times a day (TID) | SUBCUTANEOUS | Status: DC

## 2012-05-11 MED ORDER — INSULIN ASPART 100 UNIT/ML ~~LOC~~ SOLN
0.0000 [IU] | Freq: Three times a day (TID) | SUBCUTANEOUS | Status: DC
  Administered 2012-05-11 – 2012-05-13 (×5): 1 [IU] via SUBCUTANEOUS

## 2012-05-11 NOTE — Progress Notes (Signed)
Occupational Therapy Note  Patient Details  Name: Larry Williams MRN: [id number] Date of Birth: 1935/12/08 Today's Date: 05/11/2012  Time: 4098-1191 (cotx with Speech Therapy-total time 1130-1210) Pt denies pain Group Therapy  Pt participated in self feeding group with focus on increased use of RUE to assist with opening containers and setup.  Pt demonstrated use of RUE to open salt and pepper containers and remove lids from drinks.  Pt required some assistance with cutting up chicken.   Lavone Neri [facility] 05/11/2012, 3:07 PM

## 2012-05-11 NOTE — Progress Notes (Signed)
Patient ID: Larry Williams, male   DOB: [birth year], 77 y.o.   MRN: [id number] Patient ID: Larry Williams, male   DOB: 08/10/1935, 77 y.o.   MRN: [id number] Subjective/Complaints: 77 year old LH-male with history of poorly controlled DM, HTN, right hand weakness X 1 month; who was admitted to [facility] on 04/30/12 with slurred speech and right facial weakness. 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 Carotid dopplers done without ICA stenosis. 2D echo with EF 55-60% without wall abnormality. He was evaluated by Dr. Dwan Bolt (neuro) who recommended ASA as patient non complaint with this prior to admission. BSS done by ST and D3, nectar liquids recommended with strict aspiration precautions Low CBG 445pm on 4/24 Tolerating therapy, asking about D/C date  Review of Systems  Neurological: Positive for speech change and weakness.  All other systems reviewed and are negative.   Objective: Vital Signs: Blood pressure 126/70, pulse 56, temperature 97.6 F (36.4 C), temperature source Oral, resp. rate 18, weight 95.255 kg (210 lb), SpO2 96.00%. No results found. Results for orders placed during the hospital encounter of 05/02/12 (from the past 72 hour(s))  GLUCOSE, CAPILLARY     Status: Abnormal   Collection Time    05/08/12 11:15 AM      Result Value Range   Glucose-Capillary 299 (*) 70 - 99 mg/dL   Comment 1 Notify RN    GLUCOSE, CAPILLARY     Status: Abnormal   Collection Time    05/08/12  4:28 PM      Result Value Range   Glucose-Capillary 165 (*) 70 - 99 mg/dL   Comment 1 Notify RN    GLUCOSE, CAPILLARY     Status: Abnormal   Collection Time    05/08/12  8:38 PM      Result Value Range   Glucose-Capillary 121 (*) 70 - 99 mg/dL   Comment 1 Notify RN    CREATININE, SERUM     Status: Abnormal   Collection Time    05/09/12  5:21 AM      Result Value Range   Creatinine, Ser 0.84  0.50 - 1.35 mg/dL   GFR calc non Af Amer 84 (*) >90 mL/min   GFR calc Af  Amer >90  >90 mL/min   Comment:            The eGFR has been calculated     using the CKD EPI equation.     This calculation has not been     validated in all clinical     situations.     eGFR's persistently     <90 mL/min signify     possible Chronic Kidney Disease.  GLUCOSE, CAPILLARY     Status: Abnormal   Collection Time    05/09/12  7:11 AM      Result Value Range   Glucose-Capillary 101 (*) 70 - 99 mg/dL  GLUCOSE, CAPILLARY     Status: Abnormal   Collection Time    05/09/12 11:20 AM      Result Value Range   Glucose-Capillary 162 (*) 70 - 99 mg/dL  GLUCOSE, CAPILLARY     Status: None   Collection Time    05/09/12  4:41 PM      Result Value Range   Glucose-Capillary 87  70 - 99 mg/dL   Comment 1 Notify RN    GLUCOSE, CAPILLARY     Status: None   Collection Time    05/09/12  8:43 PM      Result Value Range   Glucose-Capillary 98  70 - 99 mg/dL   Comment 1 Notify RN    GLUCOSE, CAPILLARY     Status: None   Collection Time    05/10/12  7:18 AM      Result Value Range   Glucose-Capillary 80  70 - 99 mg/dL  GLUCOSE, CAPILLARY     Status: Abnormal   Collection Time    05/10/12 11:24 AM      Result Value Range   Glucose-Capillary 147 (*) 70 - 99 mg/dL  GLUCOSE, CAPILLARY     Status: Abnormal   Collection Time    05/10/12  4:46 PM      Result Value Range   Glucose-Capillary 65 (*) 70 - 99 mg/dL  GLUCOSE, CAPILLARY     Status: None   Collection Time    05/10/12  5:11 PM      Result Value Range   Glucose-Capillary 89  70 - 99 mg/dL  GLUCOSE, CAPILLARY     Status: Abnormal   Collection Time    05/10/12  9:20 PM      Result Value Range   Glucose-Capillary 165 (*) 70 - 99 mg/dL   Comment 1 Notify RN        General: No acute distress  HEENT: No evidence of facial droop. Nares clear, tongue midline, no oral lesions  Heart: Regular rate and rhythm no rubs murmurs or extra sounds  Lungs clear to auscultation  Abdomen: Positive bowel sounds soft nontender palpation   Extremities: No clubbing cyanosis or edema  Skin: No evidence of skin breakdown on pressure areas  Neuro: Cranial nerves II through XII are intact  Speech is dysarthric  No evidence of aphasia  No evidence of apraxia  Mild right upper extremity ataxia finger nose to finger testing  Motor strength is 4 minus at the right deltoid, biceps, triceps, grip  Right hand finger to thumb opposition is slowed with decreased fine motor movements  Right lower extremity 4/5 in the hip flexor knee extensor 3 minus/5 in the right ankle dorsiflexor and plantar flexor  Sensation intact to light touch and proprioception in both upper and lower limbs  Left-sided strength is normal   Assessment/Plan: 1. Functional deficits secondary to Left pontine infarct which require 3+ hours per day of interdisciplinary therapy in a comprehensive inpatient rehab setting. Physiatrist is providing close team supervision and 24 hour management of active medical problems listed below. Physiatrist and rehab team continue to assess barriers to discharge/monitor patient progress toward functional and medical goals. FIM: FIM - Bathing Bathing Steps Patient Completed: Chest;Right Arm;Left Arm;Abdomen;Front perineal area;Buttocks;Right upper leg;Left upper leg Bathing: 5: Supervision: Safety issues/verbal cues  FIM - Upper Body Dressing/Undressing Upper body dressing/undressing steps patient completed: Thread/unthread right sleeve of pullover shirt/dresss;Thread/unthread left sleeve of pullover shirt/dress;Put head through opening of pull over shirt/dress;Pull shirt over trunk Upper body dressing/undressing: 5: Supervision: Safety issues/verbal cues FIM - Lower Body Dressing/Undressing Lower body dressing/undressing steps patient completed: Thread/unthread right underwear leg;Thread/unthread left underwear leg;Pull underwear up/down;Thread/unthread right pants leg;Thread/unthread left pants leg;Pull pants up/down;Don/Doff right  sock;Don/Doff left sock;Don/Doff right shoe;Don/Doff left shoe;Fasten/unfasten right shoe;Fasten/unfasten left shoe Lower body dressing/undressing: 5: Supervision: Safety issues/verbal cues  FIM - Toileting Toileting steps completed by patient: Adjust clothing prior to toileting;Performs perineal hygiene;Adjust clothing after toileting Toileting Assistive Devices: Grab bar or rail for support Toileting: 5: Supervision: Safety issues/verbal cues  FIM - Diplomatic Services operational officer  Devices: Grab bars;Elevated toilet seat Toilet Transfers: 4-To toilet/BSC: Min A (steadying Pt. > 75%);4-From toilet/BSC: Min A (steadying Pt. > 75%)  FIM - Bed/Chair Transfer Bed/Chair Transfer Assistive Devices: Therapist, occupational: 5: Supine > Sit: Supervision (verbal cues/safety issues);5: Sit > Supine: Supervision (verbal cues/safety issues);5: Bed > Chair or W/C: Supervision (verbal cues/safety issues);5: Chair or W/C > Bed: Supervision (verbal cues/safety issues)  FIM - Locomotion: Wheelchair Distance: 75 Locomotion: Wheelchair: 0: Activity did not occur FIM - Locomotion: Ambulation Locomotion: Ambulation Assistive Devices: Designer, industrial/product Ambulation/Gait Assistance: 5: Supervision Locomotion: Ambulation: 5: Travels 150 ft or more with supervision/safety issues  Comprehension Comprehension Mode: Auditory Comprehension: 6-Follows complex conversation/direction: With extra time/assistive device  Expression Expression Mode: Verbal Expression: 6-Expresses complex ideas: With extra time/assistive device  Social Interaction Social Interaction: 6-Interacts appropriately with others with medication or extra time (anti-anxiety, antidepressant).  Problem Solving Problem Solving: 6-Solves complex problems: With extra time  Memory Memory: 6-More than reasonable amt of time  Medical Problem List and Plan:  1. DVT Prophylaxis/Anticoagulation: Pharmaceutical: Lovenox  2. Pain  Management: Tylenol as needed .  Flexor withdrawal spasms improved on klonopin  3. Mood: No evidence of depression or emotional lability. Will monitor.  4. Neuropsych: This patient Is capable of making decisions on his/her own behalf.  5. Diabetes Now Controlled continue Lantus as well as sliding scale will adjust as needed. Increase lantus to 40 Units BID,metformin restarted as well this week.reduce sliding scale 6. Hypertension by history currently not on medications will monitor 7.  No symptoms of GERD d/c zantac 8.  Hx hypomag/FEN:  f/u serum level  -encourage fluids.   -recheck lytes, mg++ ok 9.  Loose inc stools, stopped antacid,no recurrence monitor 10.  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

## 2012-05-11 NOTE — Progress Notes (Signed)
Physical Therapy Session Note  Patient Details  Name: Larry Williams MRN: [id number] Date of Birth: 1935-06-28  Today's Date: 05/11/2012 Time:Session #1:  [id number], Session #2: [id number] Time Calculation (min): Session #1: 57 min, Session #2: 42 min  Short Term Goals: Week 2:  PT Short Term Goal 1 (Week 2): STGs=LTGs  Skilled Therapeutic Interventions/Progress Updates:    Session #1: This session focused on gait with RW x 150' x 2 with supervision.  Cues with fatigue for pt to stay closer to RW, shorten his stride length.  Side stepping, marches, retro gait at railing in hallway x 25' x 2 each. Stairs 5, 4-6" height bil rails step to pattern demonstrating safest technique without cueing.  Step ups 4" and 6" x 10 each with right leg.  NuStep level 7 x 8 mins bil arms and legs.  Balance in parallel bars: rocker board ant/post, med/lat, standing on Bosu, cone taps holding to parallel bars min to mod assist to keep balance with harder activities.    Session #2: This session focused on gait with RW supervision x 200' x1 150' x 1.  Balance activities on [employer] postural stability, LOS, maze x 2 trials each one with and one without hands.  Standing dynamic balance activity with b-ball goal and horse shoes reaching up and turning to the left to place and retrieve horseshoes from goal with both right and left hands.    Therapy Documentation Precautions:  Precautions Precautions: Fall Precaution Comments: right hemiperesis Restrictions Weight Bearing Restrictions: No General:   Vital Signs:   Pain: Pain Assessment Pain Assessment: 0-10 Pain Score:   4 Pain Type: Acute pain Pain Orientation: Right (whole right side) Pain Descriptors: Sore ("I think I worked too hard yesterday) Patients Stated Pain Goal: 3 Pain Intervention(s): Repositioned;Ambulation/increased activity Multiple Pain Sites: No Mobility:   Locomotion : Ambulation Ambulation/Gait Assistance: 5: Supervision  Trunk/Postural  Assessment :    Balance:   Exercises:   Other Treatments:    See FIM for current functional status  Therapy/Group: Individual Therapy  Lurena Joiner B. Shalin Linders, PT, DPT [phone]   05/11/2012, 12:22 PM

## 2012-05-11 NOTE — Progress Notes (Signed)
Speech Language Pathology Daily Session Note  Patient Details  Name: Larry Williams MRN: [id number] Date of Birth: [birth year]  Today's Date: 05/11/2012 Time: 1130-1145 Time Calculation (min): 15 min  Short Term Goals: Week 2: SLP Short Term Goal 1 (Week 2): Patient will consume regular textures and thin liquids with modified independene for use of compensatory strategies: small bites, alternating solids and liquids. SLP Short Term Goal 2 (Week 2): Patient will consume trials of thin via straw with no overt s/s of aspiration.   Skilled Therapeutic Interventions: Patient participated in [employer] today to address dysphagia goals and self-feeding. Patient required intermittent verbal cueing for utilization of swallowing compensatory strategies and demonstrated one overt episode of aspiration throughout the meal. Pt opened containers and self-feed meal with Mod I.    FIM:  FIM - Eating Eating Activity: 5: Supervision/cues  Pain Pain Assessment Pain Assessment: No/denies pain  Therapy/Group: Group Therapy  [name] 05/11/2012, 4:47 PM

## 2012-05-11 NOTE — Progress Notes (Signed)
Occupational Therapy Session Note  Patient Details  Name: Larry Williams MRN: [id number] Date of Birth: 01-21-1935  Today's Date: 05/11/2012 Time: 4098-1191 Time Calculation (min): 45 min  Short Term Goals: Week 2:  OT Short Term Goal 1 (Week 2): STG = LTGs due to short ELOS  Skilled Therapeutic Interventions/Progress Updates:    Pt seen for ADL retraining and NM re-ed.  Pt declined wanting to shower this am and decided just to work on grooming tasks at the sink and UB bathing. Pt stood for all grooming activities brushing his teeth, washing his hands and combing his hair as well as wash UB. He was able to maintain standing for 20 mins without rest and only close supervision for balance. Able to also incorporate use of the right hand as an active assist for opening items and fixing hearing aid.  Ambulated to therapy gym with RW and close supervision without hand orthosis on RW, with pt maintaining appropriate grasp.  Engaged in gross motor activities with focus on increased shoulder flexion in standing and unsupported sitting as pt reports difficulty with combing hair and fixing collar on shirt.  Pt also reports difficulty with releasing of objects, engaged in horse shoe activity with RUE with pt tossing horse shoes and then bending to retreive, pt required min assist retrieving items as he had difficulty maintaining squat position while reaching and difficulty returning to stand.  Pt reports RLE fatigue post session, returned to bed.  Therapy Documentation Precautions:  Precautions Precautions: Fall Precaution Comments: right hemiperesis Restrictions Weight Bearing Restrictions: No Pain: Pain Assessment Pain Assessment: 0-10 Pain Score:   4 Pain Type: Acute pain Pain Orientation: Right (whole right side) Pain Descriptors: Sore ("I think I worked too hard yesterday) Pain Intervention(s): Repositioned;Ambulation/increased activity  See FIM for current functional status  Therapy/Group:  Individual Therapy  Leonette Monarch 05/11/2012, 11:04 AM

## 2012-05-11 NOTE — Progress Notes (Addendum)
Occupational Therapy Session Note  Patient Details  Name: Larry Williams MRN: [id number] Date of Birth: January 13, 1936  Today's Date: 05/11/2012 Time: 1400-1430 Time Calculation (min): 30 min  Short Term Goals: Week 1:  OT Short Term Goal 1 (Week 1): STG = LTGs due to short ELOS OT Short Term Goal 1 - Progress (Week 1): Progressing toward goal  Week 2:  OT Short Term Goal 1 (Week 2): STG = LTGs due to short ELOS  Skilled Therapeutic Interventions/Progress Updates:  No complaints of pain this session Patient found supine in bed upon entering room. Patient transferred supine -> edge of bed -> standing with rolling walker in order to ambulate from room -> ADL apartment. Once in ADL apartment, therapist removed right walker hand splint and patient able to functional use walker without hand splint. Patient engaged in furniture transfer with close supervision. Therapist then administered yellow theraputty to patient and educated patient on it's usage and exercises to improve strength throughout right hand. Patient then ambulated back to room with rolling walker (without walker hand-splint) and patient transferred back to bed. Left patient supine in bed with call bell & phone within reach.   Also, discussed with PA; splint is to be worn for comfort - mainly at night. Encouraged patient to wear splint at night only so he can functional use and strengthen hand throughout the day.   Precautions:  Precautions Precautions: Fall Precaution Comments: right hemiperesis Restrictions Weight Bearing Restrictions: No  See FIM for current functional status  Therapy/Group: Individual Therapy  Bryler Dibble 05/11/2012, 2:40 PM

## 2012-05-12 ENCOUNTER — Inpatient Hospital Stay (HOSPITAL_COMMUNITY): Payer: Medicare Other | Admitting: *Deleted

## 2012-05-12 ENCOUNTER — Inpatient Hospital Stay (HOSPITAL_COMMUNITY): Payer: Medicare Other | Admitting: Speech Pathology

## 2012-05-12 DIAGNOSIS — I69921 Dysphasia following unspecified cerebrovascular disease: Secondary | ICD-10-CM

## 2012-05-12 DIAGNOSIS — I6330 Cerebral infarction due to thrombosis of unspecified cerebral artery: Secondary | ICD-10-CM

## 2012-05-12 LAB — GLUCOSE, CAPILLARY
Glucose-Capillary: 125 mg/dL — ABNORMAL HIGH (ref 70–99)
Glucose-Capillary: 102 mg/dL — ABNORMAL HIGH (ref 70–99)
Glucose-Capillary: 65 mg/dL — ABNORMAL LOW (ref 70–99)

## 2012-05-12 NOTE — Significant Event (Signed)
Hypoglycemic Event  CBG: 65  Treatment: 15 GM carbohydrate snack  Symptoms: None  Follow-up CBG: Time:2221 CBG Result:102  Possible Reasons for Event: Unknown  Comments/MD notified:Pateint asymptomatic for hypoglycemia, Responded to 4 oz apple juice. Will continue to monitor patient for changes.     Haidee Stogsdill, Phill Mutter  Remember to initiate Hypoglycemia Order Set & complete

## 2012-05-12 NOTE — Progress Notes (Signed)
Patient ID: Larry Williams, male   DOB: [birth year], 77 y.o.   MRN: [id number]  Subjective/Complaints: 77 year old LH-male with history of poorly controlled DM, HTN, right hand weakness X 1 month; who was admitted to [facility] on 04/30/12 with slurred speech and right facial weakness. 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 Carotid dopplers done without ICA stenosis. 2D echo with EF 55-60% without wall abnormality. He was evaluated by Dr. Dwan Bolt (neuro) who recommended ASA as patient non complaint with this prior to admission. BSS done by ST and D3, nectar liquids recommended with strict aspiration precautions Low CBG 445pm on 4/24 Tolerating therapy, asking about D/C date  No specific complaints  Review of Systems  Neurological: Positive for speech change and weakness.  All other systems reviewed and are negative.   Objective: Vital Signs: Blood pressure 116/67, pulse 57, temperature 97.6 F (36.4 C), temperature source Oral, resp. rate 18, weight 210 lb (95.255 kg), SpO2 95.00%.    well-developed well-nourished male in no acute distress. HEENT exam atraumatic, normocephalic, neck supple without jugular venous distention. Chest clear to auscultation cardiac exam S1-S2 are regular. Abdominal exam overweight with bowel sounds, soft and nontender. Extremities no edema. Neurologic exam is alert with a normal gait.  Wife in room with patient  Assessment/Plan: 1. Functional deficits secondary to Left pontine infarct  Medical Problem List and Plan:  1. DVT Prophylaxis/Anticoagulation: Pharmaceutical: Lovenox  2. Pain Management: Tylenol as needed . Still with spasms 3. Mood: No evidence of depression or emotional lability. Will monitor.  4. Neuropsych: This patient Is capable of making decisions on his/her own behalf.  5. Diabetes Now Controlled  6. Hypertension by history currently not on medications will monitor BP Readings from Last 3 Encounters:   05/12/12 116/67    7.  No symptoms of GERD d/c zantac 8.  Hx hypomag/FEN:  f/u serum level  -encourage fluids.   -recheck lytes, mg++ ok 9.  Loose inc stools, stopped antacid,no recurrence monitor-no sxs 05/12/2012  10.  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

## 2012-05-12 NOTE — Progress Notes (Signed)
Speech Language Pathology Daily Session Note  Patient Details  Name: Larry Williams MRN: [id number] Date of Birth: 07-07-1935  Today's Date: 05/12/2012 Time: 1130-1155 Time Calculation (min): 25 min  Short Term Goals: Week 2: SLP Short Term Goal 1 (Week 2): Patient will consume regular textures and thin liquids with modified independene for use of compensatory strategies: small bites, alternating solids and liquids. SLP Short Term Goal 2 (Week 2): Patient will consume trials of thin via straw with no overt s/s of aspiration.   Skilled Therapeutic Interventions: Therapeutic intervention performed during [employer].  He required supervision/cues to use compensatory strategies in order to minimize aspiration risk.  He had no s/s of aspiration and stated he was told to drink liquids after he finished meal; which he did.  Straw use was not assessed.  Continue with current treatment plan.   FIM:  FIM - Eating Eating Activity: 5: Supervision/cues  Pain Pain Assessment Pain Assessment: No/denies pain  Therapy/Group: Group Therapy  Larry Williams 05/12/2012, 4:55 PM

## 2012-05-12 NOTE — Progress Notes (Signed)
Occupational Therapy Note  Patient Details  Name: Larry Williams MRN: [id number] Date of Birth: 31-Dec-1935 Today's Date: 05/12/2012  Time: 1478-2956 (cotx with Speech therapy-total time 1130-1210 Pt denies pain Group therapy  Pt participated in self feeding group with focus on increased use of RUE to assist with opening containers and setup.  Pt demonstrated use of RUE to open salt and pepper containers and remove lids from drinks.     Lavone Neri [facility] 05/12/2012, 3:10 PM

## 2012-05-12 NOTE — Progress Notes (Signed)
Occupational Therapy Note  Patient Details  Name: Larry Williams MRN: [id number] Date of Birth: 01-Mar-1935 Today's Date: 05/12/2012 Time:  1500-1530  (30 min) Individual session Pain:  None  Pt.  Ambulated around bed to make bed addressing balance, right UE movement.  Pt.  Had no LOB and was supervision to minimal assist.  Pt performed AROM exercises to RUE.  Addressed opening graham cracker opening and peanut butter using BUE.  Pt able to spread peanut butter with increased time.      Humberto Seals 05/12/2012, 3:25 PM

## 2012-05-13 ENCOUNTER — Inpatient Hospital Stay (HOSPITAL_COMMUNITY): Payer: Medicare Other | Admitting: *Deleted

## 2012-05-13 NOTE — Progress Notes (Signed)
Patient ID: Larry Williams, male   DOB: [birth year], 77 y.o.   MRN: [id number]  Subjective/Complaints: 77 year old LH-male with history of poorly controlled DM, HTN, right hand weakness X 1 month; who was admitted to [facility] on 04/30/12 with slurred speech and right facial weakness. 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 Carotid dopplers done without ICA stenosis. 2D echo with EF 55-60% without wall abnormality. He was evaluated by Dr. Dwan Bolt (neuro) who recommended ASA as patient non complaint with this prior to admission. BSS done by ST and D3, nectar liquids recommended with strict aspiration precautions Low CBG 445pm on 4/24  No specific complaints   Review of Systems  Denies cp, sob, pnd   Objective: Vital Signs: Blood pressure 147/77, pulse 56, temperature 97.9 F (36.6 C), temperature source Oral, resp. rate 19, weight 210 lb (95.255 kg), SpO2 97.00%.    well-developed well-nourished male in no acute distress. HEENT exam atraumatic, normocephalic, neck supple without jugular venous distention. Chest clear to auscultation cardiac exam S1-S2 are regular. Abdominal exam overweight with bowel sounds, soft and nontender. Extremities no edema.   Wife in room with patient  Assessment/Plan: 1. Functional deficits secondary to Left pontine infarct  Medical Problem List and Plan:  1. DVT Prophylaxis/Anticoagulation: Pharmaceutical: Lovenox  2. Pain Management: Tylenol as needed . Still with spasms 3. Mood: No evidence of depression or emotional lability. Will monitor.  4. Neuropsych: This patient Is capable of making decisions on his/her own behalf.  5. Diabetes Now Controlled , watch for hypogylcemia CBG (last 3)   Recent Labs  05/12/12 2155 05/12/12 2221 05/13/12 0724  GLUCAP 65* 102* 116*     6. Hypertension by history currently not on medications will monitor BP Readings from Last 3 Encounters:  05/13/12 147/77    7.  No symptoms  of GERD d/c zantac 8.  Hx hypomag/FEN:  f/u serum level  -encourage fluids.   -recheck lytes, mg++ ok 9.  Loose inc stools, improved 10.  Right hand PIP DJD no complaints today LOS (Days) 11 A FACE TO FACE EVALUATION WAS PERFORMED  Larry Williams 05/13/2012, 9:46 AM

## 2012-05-13 NOTE — Progress Notes (Signed)
Physical Therapy Session Note  Patient Details  Name: Larry Williams MRN: [id number] Date of Birth: 1935/10/24  Today's Date: 05/13/2012 Time: 1000-1100 Time Calculation (min): 60 min  Short Term Goals: Week 1:  PT Short Term Goal 1 (Week 1): Pt will perform bed<>chair transfers with S PT Short Term Goal 1 - Progress (Week 1): Met PT Short Term Goal 2 (Week 1): Pt will ambulate 100' with RW and S PT Short Term Goal 2 - Progress (Week 1): Met PT Short Term Goal 3 (Week 1): Pt will ascend/descend 4 stairs with 1 rail and Min A PT Short Term Goal 3 - Progress (Week 1): Met PT Short Term Goal 4 (Week 1): Pt will demonstrate dynamic standing balance with Min A for 5 min  PT Short Term Goal 4 - Progress (Week 1): Other (comment) (not tested)  Skilled Therapeutic Interventions/Progress Updates:    Patient participated in group therapy session:  -B LE there ex seated in wheelchair: alternating hip flexion, knee extension, ankle pumps x30 reps each -Ambulation: 174' with rolling walker and supervision -Obstacle course consisting of 4 cones to weave in/out of and 2 poles to step over, completed x2 with S-min assist and min verbal cues for proper RW management and safety (maintaining BOS within RW, maintaining RW on ground during turns, etc.) -Stair Negotiation: 5 stairs with B handrails and min assist to simulate home environment -Repeated sit<>stands: x20 with rolling walker and supervision -NuStep Level 6 x10' with B UE and B LE, no rest breaks  Patient returned to room and left seated in wheelchair with all needs within reach.  Therapy Documentation Precautions:  Precautions Precautions: Fall Precaution Comments: right hemiperesis Restrictions Weight Bearing Restrictions: No Pain: Pain Assessment Pain Assessment: No/denies pain Pain Score: 0-No pain Locomotion : Ambulation Ambulation/Gait Assistance: 5: Supervision Wheelchair Mobility Distance: 150   See FIM for current  functional status  Therapy/Group: Group Therapy  Terrace Chiem S Matthews Franks S. Custer Pimenta, PT, DPT  05/13/2012, 11:14 AM

## 2012-05-14 ENCOUNTER — Inpatient Hospital Stay (HOSPITAL_COMMUNITY): Payer: Medicare Other

## 2012-05-14 ENCOUNTER — Inpatient Hospital Stay (HOSPITAL_COMMUNITY): Payer: Medicare Other | Admitting: Speech Pathology

## 2012-05-14 ENCOUNTER — Inpatient Hospital Stay (HOSPITAL_COMMUNITY): Payer: Medicare Other | Admitting: Physical Therapy

## 2012-05-14 DIAGNOSIS — G8110 Spastic hemiplegia affecting unspecified side: Secondary | ICD-10-CM

## 2012-05-14 DIAGNOSIS — I6330 Cerebral infarction due to thrombosis of unspecified cerebral artery: Secondary | ICD-10-CM

## 2012-05-14 DIAGNOSIS — I69921 Dysphasia following unspecified cerebrovascular disease: Secondary | ICD-10-CM

## 2012-05-14 LAB — GLUCOSE, CAPILLARY
Glucose-Capillary: 108 mg/dL — ABNORMAL HIGH (ref 70–99)
Glucose-Capillary: 104 mg/dL — ABNORMAL HIGH (ref 70–99)

## 2012-05-14 MED ORDER — INSULIN GLARGINE 100 UNIT/ML ~~LOC~~ SOLN: 40.0000 [IU] | Freq: Two times a day (BID) | SUBCUTANEOUS | Status: AC

## 2012-05-14 MED ORDER — DICLOFENAC SODIUM 1 % TD GEL: 2.0000 g | Freq: Four times a day (QID) | TRANSDERMAL | Status: AC

## 2012-05-14 MED ORDER — CALCIUM POLYCARBOPHIL 625 MG PO TABS: 625.0000 mg | ORAL_TABLET | Freq: Every day | ORAL | Status: AC

## 2012-05-14 MED ORDER — NIACIN ER 500 MG PO CPCR: 500.0000 mg | ORAL_CAPSULE | Freq: Every day | ORAL | Status: AC

## 2012-05-14 MED ORDER — METFORMIN HCL 500 MG PO TABS: 1000.0000 mg | ORAL_TABLET | Freq: Two times a day (BID) | ORAL | Status: AC

## 2012-05-14 MED ORDER — CLONAZEPAM 0.5 MG PO TABS: 0.2500 mg | ORAL_TABLET | Freq: Every day | ORAL | Status: AC

## 2012-05-14 MED ORDER — ASPIRIN 325 MG PO TBEC: 325.0000 mg | DELAYED_RELEASE_TABLET | Freq: Every day | ORAL | Status: AC

## 2012-05-14 MED ORDER — ATORVASTATIN CALCIUM 20 MG PO TABS: 20.0000 mg | ORAL_TABLET | Freq: Every day | ORAL | Status: AC

## 2012-05-14 NOTE — Progress Notes (Signed)
Occupational Therapy Discharge Summary  Patient Details  Name: Larry Williams MRN: [id number] Date of Birth: [birth year]  Today's Date: 05/14/2012 Time: 4098-1191 Time Calculation (min): 60 min  Patient has met 12 of 12 long term goals due to improved activity tolerance, improved balance, postural control, ability to compensate for deficits, functional use of  RIGHT upper extremity, improved awareness and improved coordination.  Patient to discharge at overall Supervision to Modified Independent level.  Patient's care partner is independent to provide the necessary physical and cognitive assistance at discharge.    Reasons goals not met: n/a  Recommendation:  Patient will benefit from ongoing skilled OT services in outpatient setting to continue to advance functional skills in the area of BADL and iADL.  Equipment: No equipment provided  Reasons for discharge: treatment goals met  Patient/family agrees with progress made and goals achieved: Yes  OT Discharge Precautions/Restrictions    Pain: No c/o pain at this time.  Skilled Therapeutic Intervention: Therapy session focused on family training/education, ADL retraining, dynamic standing balance during self-care tasks and functional transfers. Pt's wife present and participate in hands on family training throughout self-care tasks. Pt demoed good safety with RW throughout therapy session and tolerated up to 12 min of standing at sink with supervision while completing grooming tasks. Pt's wife reported they will be using community bathroom at camp area and pt reported there is a built-in shower chair there. OT encouraged wife to allow pt to complete self-care tasks without assist then educated her on definition and understanding of "supervision" level of assist. Wife reported she understood this. Wife informed OT that pt could sit in chair and complete sponge bath in mobile home whenever he preferred. OT educated pt and wife on outpatient  services. Pt had yellow theraputty in room and used teach back method to ensure pt understood exercises to do with theraputty. Pt able to demonstrate and OT educated him on finger extension exercise. BUE strength was 4/5 however pt has decreased grip in Rt hand. Pt initiates use of RUE during functional tasks as he managed clothing around waist using BUE and used Rt hand to screw on toothpaste cap. Pt and wife reported no questions or concerns about discharge at this time.     See FIM for current functional status  Larry Williams, Vara Guardian 05/14/2012, 3:10 PM

## 2012-05-14 NOTE — Progress Notes (Signed)
Occupational Therapy Note  Patient Details  Name: Ace Bergfeld MRN: [id number] Date of Birth: 1935/02/24 Today's Date: 05/14/2012  Time: 1308-6578 (cotx with Speech Therapy-total time 1130-1210) Pt denies pain Group Therapy    Pt participated in self feeding group with focus on increased use of RUE to assist with opening containers and setup.  Pt demonstrated use of RUE to open salt and pepper containers and remove lids from drinks.  Pt initiated increase use of RUE for opening containers and using at diminished level.  Lavone Neri [facility] 05/14/2012, 3:34 PM

## 2012-05-14 NOTE — Progress Notes (Signed)
Physical Therapy Discharge Summary  Patient Details  Name: Larry Williams MRN: [id number] Date of Birth: 28-Jul-1935  Today's Date: 05/14/2012 Time: 5366-4403 Time Calculation (min): 15 min  Patient has met 8 of 9 long term goals due to improved activity tolerance, improved balance, improved postural control, increased strength, ability to compensate for deficits, functional use of  right upper extremity and right lower extremity, improved attention, improved awareness and improved coordination.  Patient to discharge at an ambulatory level Supervision.   Patient's care partner is independent to provide the necessary physical, cognitive and supervision assistance at discharge.  Reasons goals not met: Patient did not reach mod I level for basic transfers.  Continues to require verbal cues and supervision for safe transfers with RW secondary to LOB during pivoting or changing directions with RW.  Recommendation:  Patient will benefit from ongoing skilled PT services in outpatient setting to continue to advance safe functional mobility, address ongoing impairments in R sided weakness, impaired motor control, timing, sequencing, coordination, impaired postural control, dynamic balance, gait, and minimize fall risk.  Equipment: RW  Reasons for discharge: treatment goals met and discharge from hospital  Patient/family agrees with progress made and goals achieved: Yes  PT Discharge Vital Signs Therapy Vitals Temp: 98 F (36.7 C) Temp src: Oral Pulse Rate: 65 Resp: 18 BP: 131/80 mmHg Patient Position, if appropriate: Lying Oxygen Therapy SpO2: 93 % O2 Device: None (Room air) Pain Pain Assessment Pain Assessment: No/denies pain Sensation Sensation Light Touch: Impaired by gross assessment Proprioception: Impaired by gross assessment Additional Comments: Decreased sensation to touch on RLE; impaired proprioception RLE; h/o peripheral neuropathy bottom of feet Coordination Gross Motor  Movements are Fluid and Coordinated: No Coordination and Movement Description: Decreased timing and fluidity on R LE heel-shin Motor  Motor Motor: Hemiplegia;Ataxia Motor - Discharge Observations: Mild R sided weakness, mild R ataxia during gait  Mobility Bed Mobility Bed Mobility: Sit to Supine Supine to Sit: 6: Modified independent (Device/Increase time) Sit to Supine: 6: Modified independent (Device/Increase time) Transfers Stand Pivot Transfers: 6: Supervision with verbal cues for safety with RW while pivoting secondary to tendency for lateral LOB or lifting RW while pivoting.  Patient also demonstrated how to perform stand pivot simulated van transfer with RW and supervision sitting first and then bringing LE into [place].  Patient and wife educated on how to perform safe floor > furniture transfer if patient were to experience a fall at home and indications for calling EMS.  Patient gave repeat demonstration of floor > mat transfer with supervision overall.   Locomotion  Ambulation Ambulation: Yes Ambulation/Gait Assistance: 5: Supervision Ambulation Distance (Feet): 150 Feet Assistive device: Rolling walker in controlled and home environment.  Also performed gait x 50' without AD but with min-mod A for balance secondary to L trunk lateral lean during R stance secondary to lateral RLE weakness, decreased R stance time and decreased weight shift to R.  Patient very dependent on UE support for stability, balance and postural control.  Wife reports that to access stairs to enter house there is gravel, uneven brick and cement.  Education wife and patient on safe use of RW over uneven terrain and picking and placing RW like SW and performing step to gait sequence for safety.  Verbalized understanding.   Performed stair negotiation training for home entry/exit with LUE support on one rail and ascending and descending 4 stairs x 2 reps with supervision with verbal cues for safe sequence to ascend with  LLE and descend  with RLE.    Balance Standardized Balance Assessment Standardized Balance Assessment: Berg Balance Test Berg Balance Test Sit to Stand: Able to stand without using hands and stabilize independently Standing Unsupported: Able to stand safely 2 minutes Sitting with Back Unsupported but Feet Supported on Floor or Stool: Able to sit safely and securely 2 minutes Stand to Sit: Sits safely with minimal use of hands Transfers: Able to transfer safely, minor use of hands Standing Unsupported with Eyes Closed: Able to stand 10 seconds safely Standing Ubsupported with Feet Together: Able to place feet together independently and stand 1 minute safely From Standing, Reach Forward with Outstretched Arm: Can reach confidently >25 cm (10") From Standing Position, Pick up Object from Floor: Able to pick up shoe, needs supervision From Standing Position, Turn to Look Behind Over each Shoulder: Looks behind one side only/other side shows less weight shift Turn 360 Degrees: Able to turn 360 degrees safely but slowly Standing Unsupported, Alternately Place Feet on Step/Stool: Able to complete >2 steps/needs minimal assist Standing Unsupported, One Foot in Front: Able to take small step independently and hold 30 seconds Standing on One Leg: Tries to lift leg/unable to hold 3 seconds but remains standing independently Total Score: 44 Patient demonstrates increased fall risk as noted by score of 44/56 on Berg Balance Scale.  (<36= high risk for falls, close to 100%; 37-45 significant >80%; 46-51 moderate >50%; 52-55 lower >25%)  Extremity Assessment  RLE Assessment RLE Assessment: Exceptions to [facility] RLE Strength RLE Overall Strength Comments: 4+/5 but impaired coordination, motor control, timing/sequencing LLE Assessment LLE Assessment: Within Functional Limits  See FIM for current functional status  Edman Circle [facility] 05/14/2012, 4:09 PM

## 2012-05-14 NOTE — Progress Notes (Signed)
Speech Language Pathology Daily Session Note  Patient Details  Name: Otilio Groleau MRN: [id number] Date of Birth: Dec 27, 1935  Today's Date: 05/14/2012 Time: 1130-1155 Time Calculation (min): 25 min  Short Term Goals: Week 2: SLP Short Term Goal 1 (Week 2): Patient will consume regular textures and thin liquids with modified independene for use of compensatory strategies: small bites, alternating solids and liquids. SLP Short Term Goal 2 (Week 2): Patient will consume trials of thin via straw with no overt s/s of aspiration.   Skilled Therapeutic Interventions: Treatment focus on dysphagia goals and self-feeding.  Pt consumed regular textures with thin liquids via cup without overt s/s of aspiration but required supervision verbal cues for utilization of clearing his oral cavity before consuming liquids.    FIM:  Comprehension Comprehension Mode: Auditory Comprehension: 5-Follows basic conversation/direction: With extra time/assistive device Expression Expression Mode: Verbal Expression: 6-Expresses complex ideas: With extra time/assistive device Social Interaction Social Interaction: 5-Interacts appropriately 90% of the time - Needs monitoring or encouragement for participation or interaction. Problem Solving Problem Solving: 5-Solves basic problems: With no assist Memory Memory: 5-Recognizes or recalls 90% of the time/requires cueing < 10% of the time FIM - Eating Eating Activity: 5: Supervision/cues  Pain Pain Assessment Pain Assessment: No/denies pain  Therapy/Group: Group Therapy  Eular Panek 05/14/2012, 4:31 PM

## 2012-05-14 NOTE — Progress Notes (Signed)
Social Work Patient ID: Larry Williams, male   DOB: [birth year], 77 y.o.   MRN: [id number] Met with pt and wife to discuss discharge needs.  Pt willing to go to OP wants to go closer at [facility]. Will make referral for OP therapies, see if speech needed.  Pt also needs a rolling walker and will order this. Feels ready to go home tomorrow and wife is here to learn his care today.

## 2012-05-14 NOTE — Progress Notes (Signed)
Speech Language Pathology Daily Session Note  Patient Details  Name: Larry Williams MRN: [id number] Date of Birth: 04-10-1935  Today's Date: 05/14/2012 Time: 4098-1191 Time Calculation (min): 45 min  Short Term Goals: Week 2: SLP Short Term Goal 1 (Week 2): Patient will consume regular textures and thin liquids with modified independene for use of compensatory strategies: small bites, alternating solids and liquids. SLP Short Term Goal 2 (Week 2): Patient will consume trials of thin via straw with no overt s/s of aspiration.   Skilled Therapeutic Interventions: Skilled treatment session focused on dysphagia goals as well as pt and family education. SLP facilitated by providing upgraded trials of thin liquids via straw sips, which pt consumed without overt s/s of aspiration with superivision level cues. Pt and wife participaed in an educational session wtih SLP that focused on reinforcement of safe swallowing precautions and recommended strategies. Intermittent supervision of pt throughout PO intake was recommended, and wife reports that they already share all of their meals together, so supervision will not be a problem. SLP also reviewed s/s of aspiration to look for and the risks of aspiration, including aspiraiton PNA.   FIM:  Comprehension Comprehension Mode: Auditory Comprehension: 5-Follows basic conversation/direction: With extra time/assistive device Expression Expression Mode: Verbal Expression: 6-Expresses complex ideas: With extra time/assistive device Social Interaction Social Interaction: 5-Interacts appropriately 90% of the time - Needs monitoring or encouragement for participation or interaction. Problem Solving Problem Solving: 5-Solves basic problems: With no assist Memory Memory: 5-Recognizes or recalls 90% of the time/requires cueing < 10% of the time FIM - Eating Eating Activity: 5: Supervision/cues  Pain Pain Assessment Pain Assessment: No/denies  pain  Therapy/Group: Individual Therapy   Maxcine Ham, M.A. CF-SLP  Maxcine Ham 05/14/2012, 12:23 PM

## 2012-05-14 NOTE — Progress Notes (Signed)
Patient ID: Larry Williams, male   DOB: 07-14-1935, 77 y.o.   MRN: [id number] Subjective/Complaints: 77 year old LH-male with history of poorly controlled DM, HTN, right hand weakness X 1 month; who was admitted to [facility] on 04/30/12 with slurred speech and right facial weakness. 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 Carotid dopplers done without ICA stenosis. 2D echo with EF 55-60% without wall abnormality. He was evaluated by Dr. Dwan Bolt (neuro) who recommended ASA as patient non complaint with this prior to admission. BSS done by ST and D3, nectar liquids recommended with strict aspiration precautions  Eating breakfast, asking about D/C date  Review of Systems  Neurological: Positive for speech change and weakness.  All other systems reviewed and are negative.   Objective: Vital Signs: Blood pressure 158/84, pulse 54, temperature 97.6 F (36.4 C), temperature source Oral, resp. rate 19, weight 95.255 kg (210 lb), SpO2 96.00%. No results found. Results for orders placed during the hospital encounter of 05/02/12 (from the past 72 hour(s))  GLUCOSE, CAPILLARY     Status: Abnormal   Collection Time    05/11/12 11:19 AM      Result Value Range   Glucose-Capillary 127 (*) 70 - 99 mg/dL  GLUCOSE, CAPILLARY     Status: Abnormal   Collection Time    05/11/12  4:06 PM      Result Value Range   Glucose-Capillary 107 (*) 70 - 99 mg/dL  GLUCOSE, CAPILLARY     Status: Abnormal   Collection Time    05/11/12  8:40 PM      Result Value Range   Glucose-Capillary 112 (*) 70 - 99 mg/dL   Comment 1 Notify RN    GLUCOSE, CAPILLARY     Status: Abnormal   Collection Time    05/12/12  7:29 AM      Result Value Range   Glucose-Capillary 126 (*) 70 - 99 mg/dL  GLUCOSE, CAPILLARY     Status: Abnormal   Collection Time    05/12/12 11:12 AM      Result Value Range   Glucose-Capillary 129 (*) 70 - 99 mg/dL  GLUCOSE, CAPILLARY     Status: Abnormal   Collection Time    05/12/12  4:25 PM      Result Value Range   Glucose-Capillary 125 (*) 70 - 99 mg/dL  GLUCOSE, CAPILLARY     Status: Abnormal   Collection Time    05/12/12  9:55 PM      Result Value Range   Glucose-Capillary 65 (*) 70 - 99 mg/dL   Comment 1 Notify RN     Comment 2 Documented in Chart    GLUCOSE, CAPILLARY     Status: Abnormal   Collection Time    05/12/12 10:21 PM      Result Value Range   Glucose-Capillary 102 (*) 70 - 99 mg/dL   Comment 1 Notify RN     Comment 2 Documented in Chart    GLUCOSE, CAPILLARY     Status: Abnormal   Collection Time    05/13/12  7:24 AM      Result Value Range   Glucose-Capillary 116 (*) 70 - 99 mg/dL  GLUCOSE, CAPILLARY     Status: None   Collection Time    05/13/12 11:29 AM      Result Value Range   Glucose-Capillary 89  70 - 99 mg/dL  GLUCOSE, CAPILLARY     Status: Abnormal   Collection  Time    05/13/12  4:29 PM      Result Value Range   Glucose-Capillary 136 (*) 70 - 99 mg/dL  GLUCOSE, CAPILLARY     Status: Abnormal   Collection Time    05/13/12  8:39 PM      Result Value Range   Glucose-Capillary 179 (*) 70 - 99 mg/dL   Comment 1 Notify RN        General: No acute distress  HEENT: No evidence of facial droop. Nares clear, tongue midline, no oral lesions  Heart: Regular rate and rhythm no rubs murmurs or extra sounds  Lungs clear to auscultation  Abdomen: Positive bowel sounds soft nontender palpation  Extremities: No clubbing cyanosis or edema  Skin: No evidence of skin breakdown on pressure areas  Neuro: Cranial nerves II through XII are intact  Speech is dysarthric  No evidence of aphasia  No evidence of apraxia  Mild right upper extremity ataxia finger nose to finger testing  Motor strength is 4 minus at the right deltoid, biceps, triceps, grip  Right hand finger to thumb opposition is slowed with decreased fine motor movements  Right lower extremity 4/5 in the hip flexor knee extensor 3 minus/5 in the  right ankle dorsiflexor and plantar flexor  Sensation intact to light touch and proprioception in both upper and lower limbs  Left-sided strength is normal   Assessment/Plan: 1. Functional deficits secondary to Left pontine infarct which require 3+ hours per day of interdisciplinary therapy in a comprehensive inpatient rehab setting. Physiatrist is providing close team supervision and 24 hour management of active medical problems listed below. Physiatrist and rehab team continue to assess barriers to discharge/monitor patient progress toward functional and medical goals. FIM: FIM - Bathing Bathing Steps Patient Completed: Chest;Right Arm;Left Arm;Abdomen Bathing: 0: Activity did not occur  FIM - Upper Body Dressing/Undressing Upper body dressing/undressing steps patient completed: Thread/unthread right sleeve of pullover shirt/dresss;Thread/unthread left sleeve of pullover shirt/dress;Put head through opening of pull over shirt/dress;Pull shirt over trunk Upper body dressing/undressing: 0: Activity did not occur FIM - Lower Body Dressing/Undressing Lower body dressing/undressing steps patient completed: Thread/unthread right underwear leg;Thread/unthread left underwear leg;Pull underwear up/down;Thread/unthread right pants leg;Thread/unthread left pants leg;Pull pants up/down;Don/Doff right sock;Don/Doff left sock;Don/Doff right shoe;Don/Doff left shoe;Fasten/unfasten right shoe;Fasten/unfasten left shoe Lower body dressing/undressing: 0: Activity did not occur  FIM - Toileting Toileting steps completed by patient: Adjust clothing prior to toileting;Performs perineal hygiene;Adjust clothing after toileting Toileting Assistive Devices: Grab bar or rail for support Toileting: 0: Activity did not occur  FIM - Diplomatic Services operational officer Devices: Grab bars;Elevated toilet seat Toilet Transfers: 4-To toilet/BSC: Min A (steadying Pt. > 75%);4-From toilet/BSC: Min A (steadying  Pt. > 75%)  FIM - Banker Devices: Walker;Arm rests Bed/Chair Transfer: 5: Bed > Chair or W/C: Supervision (verbal cues/safety issues);5: Chair or W/C > Bed: Supervision (verbal cues/safety issues)  FIM - Locomotion: Wheelchair Distance: 150 Locomotion: Wheelchair: 1: Total Assistance/staff pushes wheelchair (Pt<25%) FIM - Locomotion: Ambulation Locomotion: Ambulation Assistive Devices: Designer, industrial/product Ambulation/Gait Assistance: 5: Supervision Locomotion: Ambulation: 5: Travels 150 ft or more with supervision/safety issues  Comprehension Comprehension Mode: Auditory Comprehension: 6-Follows complex conversation/direction: With extra time/assistive device  Expression Expression Mode: Verbal Expression: 6-Expresses complex ideas: With extra time/assistive device  Social Interaction Social Interaction: 6-Interacts appropriately with others with medication or extra time (anti-anxiety, antidepressant).  Problem Solving Problem Solving: 6-Solves complex problems: With extra time  Memory Memory: 6-More than reasonable amt  of time  Medical Problem List and Plan:  1. DVT Prophylaxis/Anticoagulation: Pharmaceutical: Lovenox  2. Pain Management: Tylenol as needed .  Flexor withdrawal spasms improved on klonopin  3. Mood: No evidence of depression or emotional lability. Will monitor.  4. Neuropsych: This patient Is capable of making decisions on his/her own behalf.  5. Diabetes Now Controlled continue Lantus as well as sliding scale will adjust as needed. Increase lantus to 40 Units BID,metformin restarted as well this week.reduce sliding scale 6. Hypertension by history currently not on medications will monitor 7.  No symptoms of GERD d/c zantac 8.  Hx hypomag/FEN:  f/u serum level  -encourage fluids.   -recheck lytes, mg++ ok 9.  Loose inc stools, stopped antacid,no recurrence monitor 10.  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

## 2012-05-15 DIAGNOSIS — G8110 Spastic hemiplegia affecting unspecified side: Secondary | ICD-10-CM

## 2012-05-15 DIAGNOSIS — I6330 Cerebral infarction due to thrombosis of unspecified cerebral artery: Secondary | ICD-10-CM

## 2012-05-15 DIAGNOSIS — I69921 Dysphasia following unspecified cerebrovascular disease: Secondary | ICD-10-CM

## 2012-05-15 LAB — GLUCOSE, CAPILLARY

## 2012-05-15 NOTE — Progress Notes (Signed)
Social Work Discharge Note Discharge Note  The overall goal for the admission was met for:   Discharge location: Yes-HOME WITH WIFE IN [place] THEN EVENTUALLY HOME TO [place]  Length of Stay: Yes-13 DAYS  Discharge activity level: Yes-SUPERVISION/MOD/I LEVEL  Home/community participation: Yes  Services provided included: MD, RD, PT, OT, SLP, RN, TR, Pharmacy and SW  Financial Services: Medicare and Other: [employer] INS[employer]  Follow-up services arranged: Outpatient: [facility]-OPPT, OT 4/30 1;30PM, DME: [facility] and Patient/Family has no preference for HH/DME agencies  Comments (or additional information):FAMILY EDUCATION COMPLETED AND BOTH COMFORTABLE WITH DISCHARGE PLANS  Patient/Family verbalized understanding of follow-up arrangements: Yes  Individual responsible for coordination of the follow-up plan: SELF & JUDY-WIFE  Confirmed correct DME delivered: Lucy Chris 05/15/2012    Lucy Chris

## 2012-05-15 NOTE — Discharge Summary (Signed)
Physician Discharge Summary  Patient ID: Larry Williams MRN: [id number] DOB/AGE: 1935-07-18 77 y.o.  Admit date: 05/02/2012 Discharge date: 05/15/2012  Discharge Diagnoses:  Principal Problem:   Left pontine CVA Active Problems:   HTN (hypertension)   Diabetes   Discharged Condition: Good.   Significant Diagnostic Studies: Dg Hand Complete Right  10-May-2012  *RADIOLOGY REPORT*  Clinical Data: Weakness and hand when flexing fingers for 1 month  RIGHT HAND - COMPLETE 3+ VIEW  Comparison: None  Findings: Osseous demineralization. Scattered degenerative changes of interphalangeal joints. Intercarpal and MCP joints preserved. No acute fracture, dislocation or bone destruction.  IMPRESSION: Scattered degenerative changes of interphalangeal joints. No acute abnormalities.   Original Report Authenticated By: Larry Williams, M.D.     Labs:  Basic Metabolic Panel:  Recent Labs Lab 2012/05/10 0521  CREATININE 0.84    CBC: No results found for this basename: WBC, NEUTROABS, HGB, HCT, MCV, PLT,  in the last 168 hours  CBG:  Recent Labs Lab 05/14/12 0713 05/14/12 1125 05/14/12 1640 05/14/12 2137 05/15/12 0733  GLUCAP 108* 92 104* 91 80    Brief HPI:   Mr. Larry Williams is a 77 year old LH-male with history of poorly controlled DM, HTN, right hand weakness X 1 month; who was admitted to [facility] on 04/30/12 with slurred speech and right facial weakness. 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 BSS done by ST and D3, nectar liquids recommended with strict aspiration precautions. Therapy evaluations done and patient was noted to be limited by right sided weakness with decreased coordination and poor safety with walker. CIR recommended and patient admitted for further therapies.    Hospital Course: Larry Williams was admitted to rehab 05/02/2012 for inpatient therapies to consist of PT, ST and OT at least three hours five days a week. Past admission  physiatrist, therapy team and rehab RN have worked together to provide customized collaborative inpatient rehab. His blood pressures were monitored on bid basis and have been slowly trending upwards. Norvasc was resumed at discharge and he was advised to continue holding cozaar. His diabetes has been poorly controlled with Hgb A1c at 9.4. Blood sugars were monitored on AC/HS basis and medications were titrated upward for better control. He was maintained on niacin due to elevated triglycerides (270) and on lipitor for LDL-32. He is tolerating these medications without SE. Po intake has been good. He reported right wrist pain with carpel tunnel symptoms. X rays   Reveal right hand PIP DJD. Resting hand splint as well as voltaren gel have greatly helped with pain control. Patient has made great progress during his stay. He has had improvement strength and balance.   Rehab course: During patient's stay in rehab weekly team conferences were held to monitor patient's progress, set goals and discuss barriers to discharge. Speech therapy has worked with patient on dysphagia as well as speech clarity. Patient was advanced to regular textures with thin liquids. He is tolerating current diet without signs or symptoms of aspiration. He does continue to require intermittent cues for safe swallowing precautions and recommended strategies.Occupational therapy on ADL tasks as well as neuro reeducation of RUE. He is showing increased ability to compensate for deficits, functional use of RIGHT upper extremity as well as improved awareness and coordination. He's modified independent to supervision for bathing and dressing.   Physical therapy has focused on activity tolerance, balance, postural control as well as strengthening. Patient is at supervision level for ambulating due to  need for verbal cues for safe transfers especially during pivoting and changing directions. He will continue to receive follow up outpatient therapies  past discharge.    Disposition: Home   Diet:  Diabetic diet. Low fat. Low cholesterol.  Special Instructions: 1. Check blood sugars before meals and/or at bedtime--at least couple 2-3 times a day.  2. Wear splint on hand for comfort/pain.  3. NO driving till cleared by MD.  4. Outpatient PT, OT at [facility]  (Phone:[phone]). Appointment  Date/Time :4/30 Wednesday @1 :30 PM    Future Appointments Provider Department Dept Phone   06/07/2012 12:30 PM [facility]ace[facility] [phone]   Leave all valuables at home.       Medication List    STOP taking these medications       aspirin 81 MG tablet     losartan 50 MG tablet  Commonly known as:  COZAAR      TAKE these medications       acetaminophen 650 MG CR tablet  Commonly known as:  TYLENOL  Take 650 mg by mouth every 8 (eight) hours as needed for pain.     aspirin 325 MG EC tablet  Take 1 tablet (325 mg total) by mouth daily.     atorvastatin 20 MG tablet  Commonly known as:  LIPITOR  Take 1 tablet (20 mg total) by mouth daily at 6 PM.     clonazePAM 0.5 MG tablet  Commonly known as:  KLONOPIN  Take 0.5 tablets (0.25 mg total) by mouth at bedtime.     diclofenac sodium 1 % Gel  Commonly known as:  VOLTAREN  Apply 2 g topically 4 (four) times daily.     insulin glargine 100 UNIT/ML injection  Commonly known as:  LANTUS  Inject 0.4 mLs (40 Units total) into the skin 2 (two) times daily.     metFORMIN 500 MG tablet  Commonly known as:  GLUCOPHAGE  Take 2 tablets (1,000 mg total) by mouth 2 (two) times daily with a meal.     niacin 500 MG CR capsule  Take 1 capsule (500 mg total) by mouth at bedtime.     Amlodipine 5  MG tablet  Commonly known as:  NORVASC  Take one tablet (5 mg) daily.            Follow-up Information   Follow up with Larry Colace, MD On 06/07/2012. (Be there at 12:00 for 12:30 pm for EMG/ Hospital follow up. NOTE  ADDRESS--ACROSS FROM [facility]. )    Contact information:   [address] [place] [address] [place] [address] [phone]       Signed: Jacquelynn Williams 05/15/2012, 10:42 AM

## 2012-05-15 NOTE — Progress Notes (Signed)
Discharged to home accompanied by wife. Discharge info given without questions rechecked BP PTD= med clarification by PA. Pamelia Hoit

## 2012-05-15 NOTE — Progress Notes (Signed)
Patient ID: Larry Williams, male   DOB: 31-May-1935, 77 y.o.   MRN: [id number] Subjective/Complaints: 77 year old LH-male with history of poorly controlled DM, HTN, right hand weakness X 1 month; who was admitted to [facility] on 04/30/12 with slurred speech and right facial weakness. 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 Carotid dopplers done without ICA stenosis. 2D echo with EF 55-60% without wall abnormality. He was evaluated by Dr. Dwan Bolt (neuro) who recommended ASA as patient non complaint with this prior to admission. BSS done by ST and D3, nectar liquids recommended with strict aspiration precautions  Excited about D/C, still has pain and numbness RUE  Review of Systems  Neurological: Positive for speech change and weakness.  All other systems reviewed and are negative.   Objective: Vital Signs: Blood pressure 151/76, pulse 54, temperature 97.7 F (36.5 C), temperature source Oral, resp. rate 20, weight 95.255 kg (210 lb), SpO2 98.00%. No results found. Results for orders placed during the hospital encounter of 05/02/12 (from the past 72 hour(s))  GLUCOSE, CAPILLARY     Status: Abnormal   Collection Time    05/12/12 11:12 AM      Result Value Range   Glucose-Capillary 129 (*) 70 - 99 mg/dL  GLUCOSE, CAPILLARY     Status: Abnormal   Collection Time    05/12/12  4:25 PM      Result Value Range   Glucose-Capillary 125 (*) 70 - 99 mg/dL  GLUCOSE, CAPILLARY     Status: Abnormal   Collection Time    05/12/12  9:55 PM      Result Value Range   Glucose-Capillary 65 (*) 70 - 99 mg/dL   Comment 1 Notify RN     Comment 2 Documented in Chart    GLUCOSE, CAPILLARY     Status: Abnormal   Collection Time    05/12/12 10:21 PM      Result Value Range   Glucose-Capillary 102 (*) 70 - 99 mg/dL   Comment 1 Notify RN     Comment 2 Documented in Chart    GLUCOSE, CAPILLARY     Status: Abnormal   Collection Time    05/13/12  7:24 AM      Result  Value Range   Glucose-Capillary 116 (*) 70 - 99 mg/dL  GLUCOSE, CAPILLARY     Status: None   Collection Time    05/13/12 11:29 AM      Result Value Range   Glucose-Capillary 89  70 - 99 mg/dL  GLUCOSE, CAPILLARY     Status: Abnormal   Collection Time    05/13/12  4:29 PM      Result Value Range   Glucose-Capillary 136 (*) 70 - 99 mg/dL  GLUCOSE, CAPILLARY     Status: Abnormal   Collection Time    05/13/12  8:39 PM      Result Value Range   Glucose-Capillary 179 (*) 70 - 99 mg/dL   Comment 1 Notify RN    GLUCOSE, CAPILLARY     Status: Abnormal   Collection Time    05/14/12  7:13 AM      Result Value Range   Glucose-Capillary 108 (*) 70 - 99 mg/dL   Comment 1 Notify RN    GLUCOSE, CAPILLARY     Status: None   Collection Time    05/14/12 11:25 AM      Result Value Range   Glucose-Capillary 92  70 - 99 mg/dL  Comment 1 Notify RN    GLUCOSE, CAPILLARY     Status: Abnormal   Collection Time    05/14/12  4:40 PM      Result Value Range   Glucose-Capillary 104 (*) 70 - 99 mg/dL  GLUCOSE, CAPILLARY     Status: None   Collection Time    05/14/12  9:37 PM      Result Value Range   Glucose-Capillary 91  70 - 99 mg/dL  GLUCOSE, CAPILLARY     Status: None   Collection Time    05/15/12  7:33 AM      Result Value Range   Glucose-Capillary 80  70 - 99 mg/dL   Comment 1 Notify RN        General: No acute distress  HEENT: No evidence of facial droop. Nares clear, tongue midline, no oral lesions  Heart: Regular rate and rhythm no rubs murmurs or extra sounds  Lungs clear to auscultation  Abdomen: Positive bowel sounds soft nontender palpation  Extremities: No clubbing cyanosis or edema  Skin: No evidence of skin breakdown on pressure areas  Neuro: Cranial nerves II through XII are intact  Speech is dysarthric  No evidence of aphasia  No evidence of apraxia  Mild right upper extremity ataxia finger nose to finger testing  Motor strength is 4 minus at the right deltoid,  biceps, triceps, grip  Right hand finger to thumb opposition is slowed with decreased fine motor movements  Right lower extremity 4/5 in the hip flexor knee extensor 3 minus/5 in the right ankle dorsiflexor and plantar flexor  Sensation intact to light touch and proprioception in both upper and lower limbs  Left-sided strength is normal   Assessment/Plan: 1. Functional deficits secondary to Left pontine infarct Stable for D/C today F/u PCP in 1-2 weeks F/u PM&R 3 weeks for EMG RUE R/O CTS See D/C summary See D/C instructionsFIM: FIM - Bathing Bathing Steps Patient Completed: Chest;Buttocks;Right Arm;Right upper leg;Left upper leg;Left Arm;Front perineal area;Right lower leg (including foot);Abdomen;Left lower leg (including foot) Bathing: 6: Assistive device (Comment) (shower chair)  FIM - Upper Body Dressing/Undressing Upper body dressing/undressing steps patient completed: Thread/unthread right sleeve of pullover shirt/dresss;Thread/unthread left sleeve of pullover shirt/dress;Put head through opening of pull over shirt/dress;Pull shirt over trunk Upper body dressing/undressing: 6: More than reasonable amount of time FIM - Lower Body Dressing/Undressing Lower body dressing/undressing steps patient completed: Thread/unthread right underwear leg;Thread/unthread left underwear leg;Pull underwear up/down;Thread/unthread right pants leg;Thread/unthread left pants leg;Pull pants up/down;Don/Doff right sock;Don/Doff left sock;Don/Doff right shoe;Don/Doff left shoe;Fasten/unfasten right shoe;Fasten/unfasten left shoe Lower body dressing/undressing: 6: Assistive device (Comment)  FIM - Toileting Toileting steps completed by patient: Adjust clothing prior to toileting;Performs perineal hygiene;Adjust clothing after toileting Toileting Assistive Devices: Grab bar or rail for support Toileting: 6: Assistive device: No helper  FIM - Diplomatic Services operational officer Devices: Grab  bars;Elevated toilet seat Toilet Transfers: 6-Assistive device: No helper  FIM - Banker Devices: Therapist, occupational: 6: Supine > Sit: No assist;6: Sit > Supine: No assist;5: Bed > Chair or W/C: Supervision (verbal cues/safety issues);5: Chair or W/C > Bed: Supervision (verbal cues/safety issues)  FIM - Locomotion: Wheelchair Distance: 150 Locomotion: Wheelchair: 0: Activity did not occur FIM - Locomotion: Ambulation Locomotion: Ambulation Assistive Devices: Designer, industrial/product Ambulation/Gait Assistance: 5: Supervision Locomotion: Ambulation: 5: Travels 150 ft or more with supervision/safety issues  Comprehension Comprehension Mode: Auditory Comprehension: 5-Follows basic conversation/direction: With extra time/assistive device  Expression Expression Mode:  Verbal Expression: 6-Expresses complex ideas: With extra time/assistive device  Social Interaction Social Interaction: 5-Interacts appropriately 90% of the time - Needs monitoring or encouragement for participation or interaction.  Problem Solving Problem Solving: 5-Solves basic problems: With no assist  Memory Memory: 5-Recognizes or recalls 90% of the time/requires cueing < 10% of the time  Medical Problem List and Plan:  1. DVT Prophylaxis/Anticoagulation: Pharmaceutical:D/C Lovenox  2. Pain Management: Tylenol as needed .  Flexor withdrawal spasms improved on klonopin  3. Mood: No evidence of depression or emotional lability. Will monitor.  4. Neuropsych: This patient Is capable of making decisions on his/her own behalf.  5. Diabetes Now Controlled continue Lantus as well as sliding scale will adjust as needed. Increase lantus to 40 Units BID,metformin restarted as well this week.reduce sliding scale 6. Hypertension by history currently not on medications will monitor 7.  No symptoms of GERD d/c zantac 8.  Hx hypomag/FEN:  f/u serum level  -encourage fluids.   -recheck  lytes, mg++ ok 9.  Loose inc stools, stopped antacid,no recurrence monitor 10.  Right hand PIP DJD also with probable CTS  wrist splint helpful LOS (Days) 13 A FACE TO FACE EVALUATION WAS PERFORMED  Katrenia Alkins E 05/15/2012, 8:05 AM

## 2012-05-15 NOTE — Progress Notes (Signed)
Speech Language Pathology Discharge Summary  Patient Details  Name: Larry Williams MRN: [id number] Date of Birth: July 21, 1935  Today's Date: 05/15/2012  Patient has met 3 of 4 long term goals.  Patient to discharge at overall Supervision;Modified Independent level.  Reasons goals not met: patient requires supervision with p.o. intake   Clinical Impression/Discharge Summary: Patient met 3 out of 4 long term goals during CIR stay due to gains in speech intelligibility and swallow function.  Patient advanced from Dys.3 textrues with nectar-thick liquids to regular textures with thin liquids with supervision due to impulsivity.  Wife able to provide supervision; however, patient would benefit from follow-up to ensure toleration and carryover after discharge.    Care Partner:  Caregiver Able to Provide Assistance: Yes  Type of Caregiver Assistance:  (dysphagia)  Recommendation:  Home Health SLP  Rationale for SLP Follow Up: Maximize swallowing safety;Reduce caregiver burden   Equipment: none   Reasons for discharge: Treatment goals met;Discharged from hospital   Patient/Family Agrees with Progress Made and Goals Achieved: Yes   See FIM for current functional status  Charlane Ferretti., CCC-SLP [phone]  Markayla Reichart 05/15/2012, 6:01 PM

## 2012-06-07 ENCOUNTER — Encounter: Payer: Self-pay | Admitting: Physical Medicine & Rehabilitation

## 2012-06-07 ENCOUNTER — Ambulatory Visit (HOSPITAL_BASED_OUTPATIENT_CLINIC_OR_DEPARTMENT_OTHER): Payer: Medicare Other | Admitting: Physical Medicine & Rehabilitation

## 2012-06-07 ENCOUNTER — Encounter: Payer: Medicare Other | Attending: Physical Medicine & Rehabilitation

## 2012-06-07 VITALS — BP 129/72 | HR 57 | Resp 16 | Ht 67.0 in | Wt 211.0 lb

## 2012-06-07 DIAGNOSIS — R209 Unspecified disturbances of skin sensation: Secondary | ICD-10-CM | POA: Insufficient documentation

## 2012-06-07 DIAGNOSIS — G5610 Other lesions of median nerve, unspecified upper limb: Secondary | ICD-10-CM

## 2012-06-07 DIAGNOSIS — M79609 Pain in unspecified limb: Secondary | ICD-10-CM | POA: Insufficient documentation

## 2012-06-07 NOTE — Progress Notes (Signed)
EMG performed 06/07/2012.  See EMG report under media tab.

## 2012-06-07 NOTE — Patient Instructions (Signed)
Please take your EMG report to your MD in [place] when you return next week

## 2014-01-03 IMAGING — US US CAROTID DUPLEX BILAT
1 series · 14 of 24 positions shown · non-contrast
Comparison: none

REASON FOR EXAM: tia/cva
COMMENTS:

[Series 1: us carotid duplex bilat · 14 of 60 slices shown]
[im 1/60]
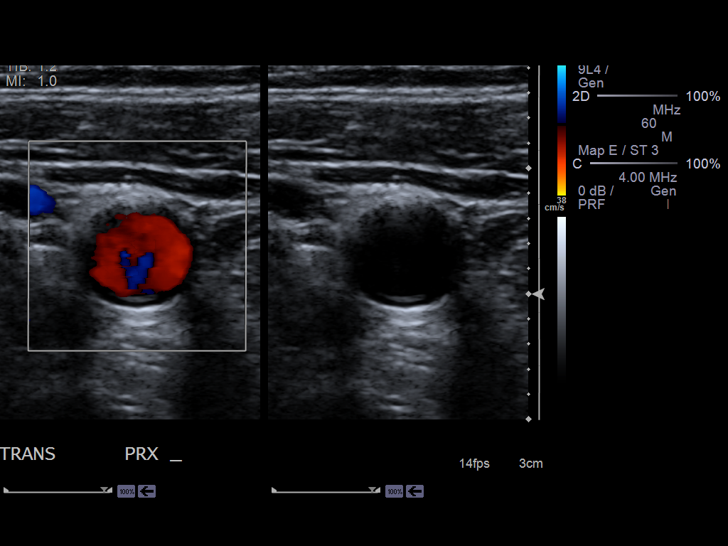
[im 6/60]
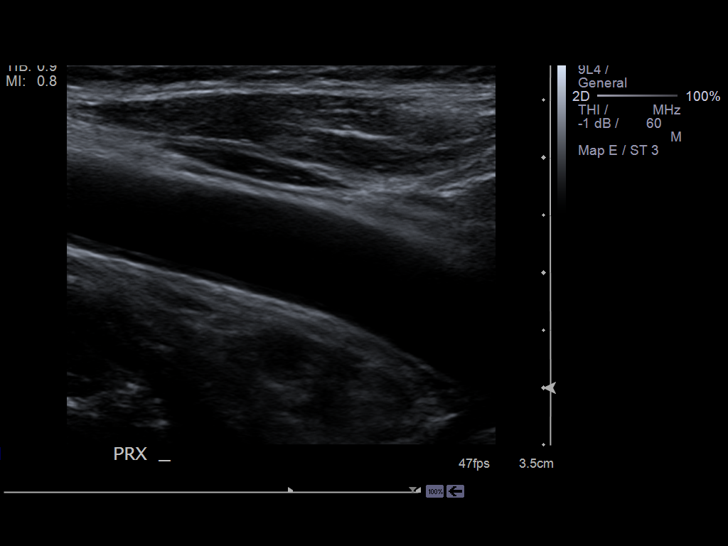
[im 11/60]
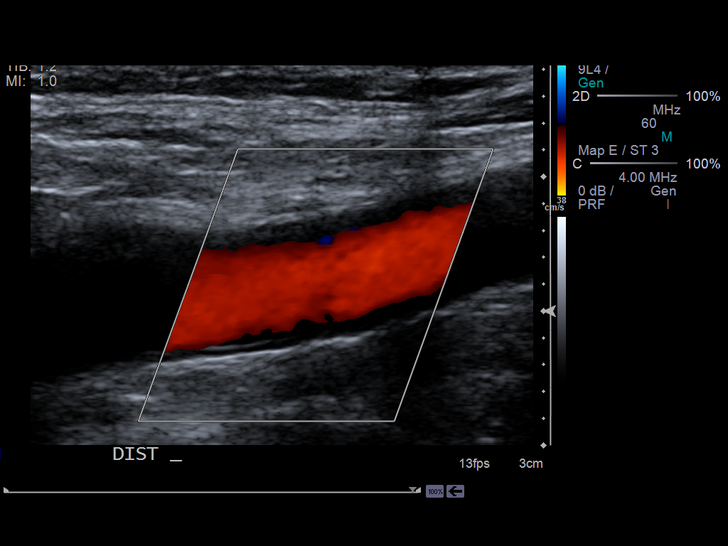
[im 16/60]
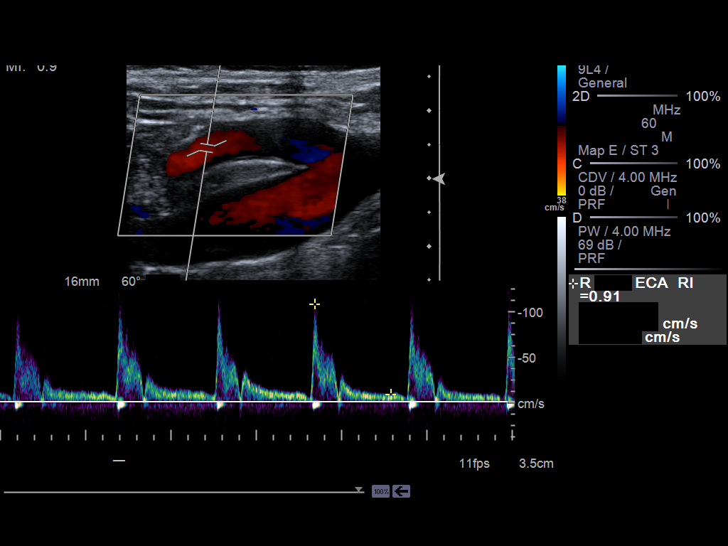
[im 18/60]
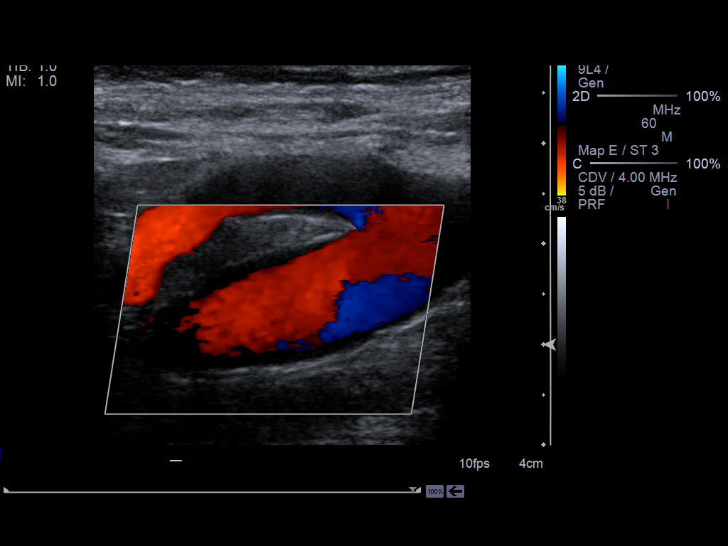
[im 24/60]
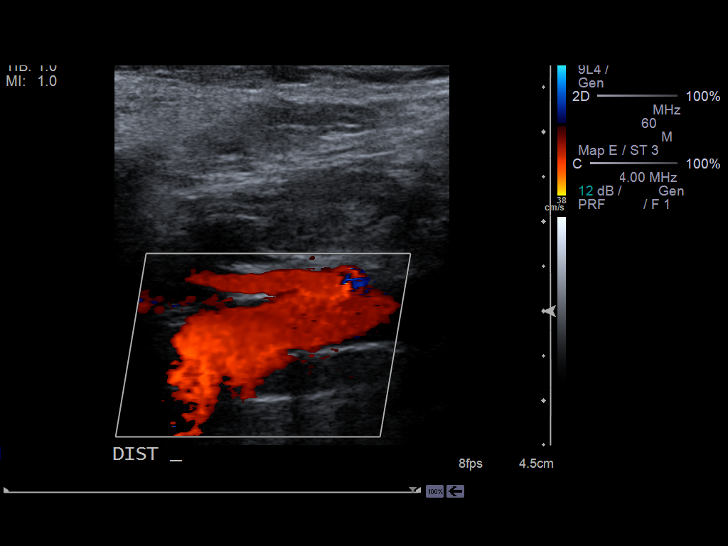
[im 29/60]
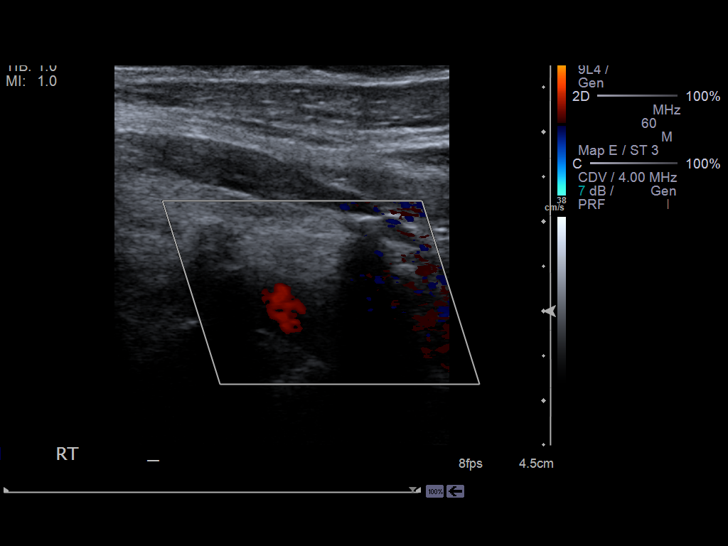
[im 31/60]
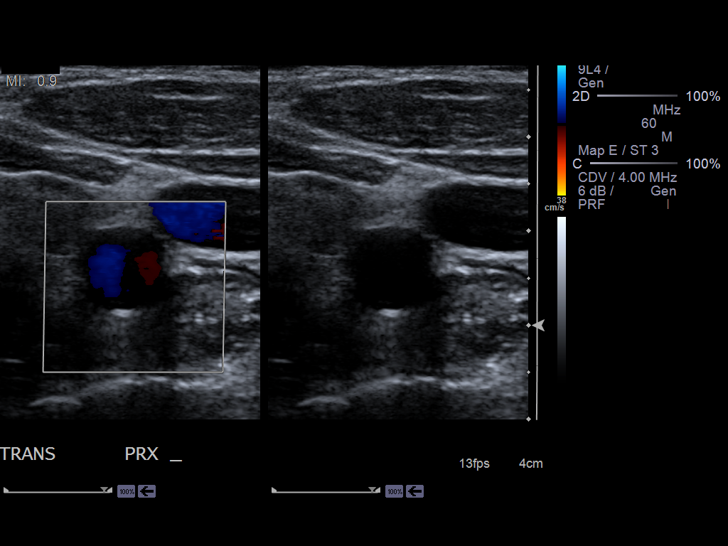
[im 36/60]
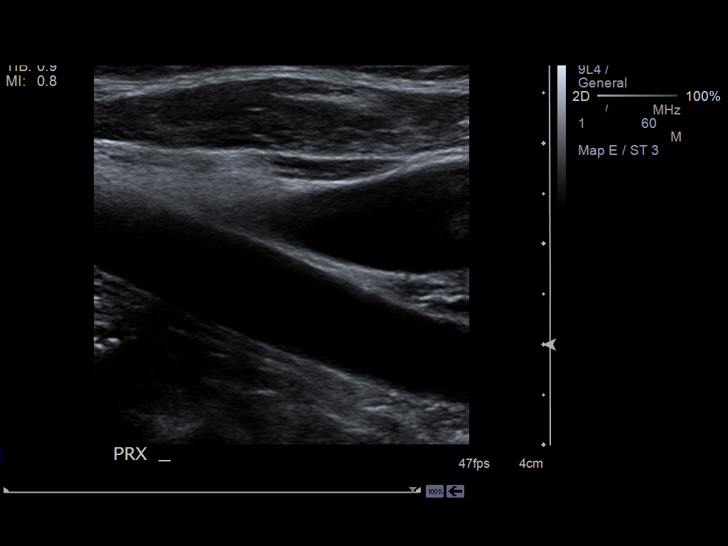
[im 42/60]
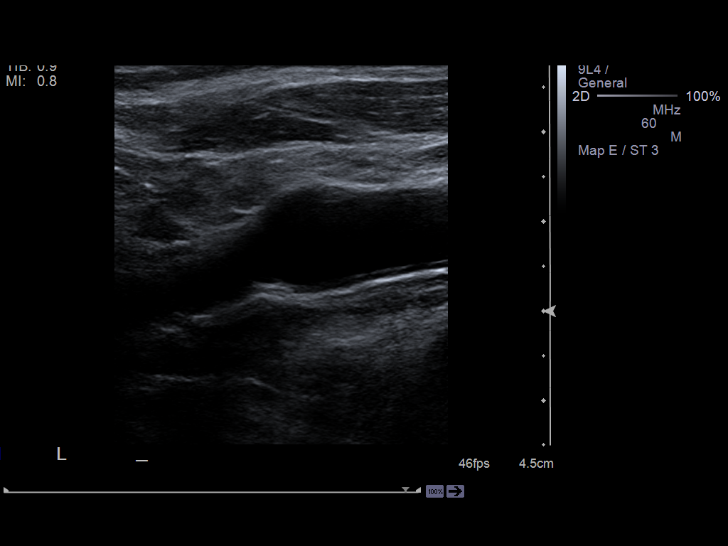
[im 47/60]
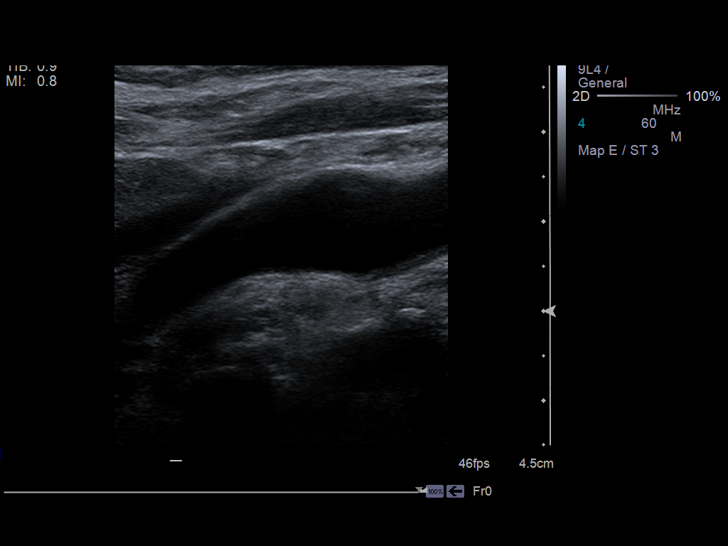
[im 49/60]
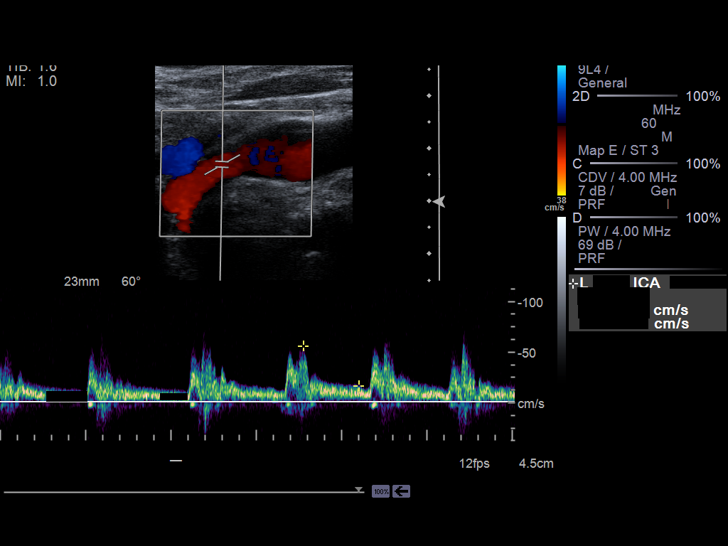
[im 54/60]
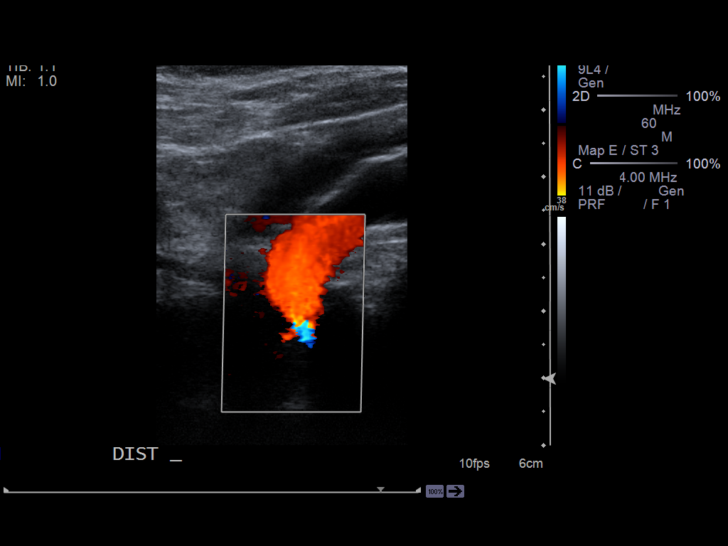
[im 60/60]
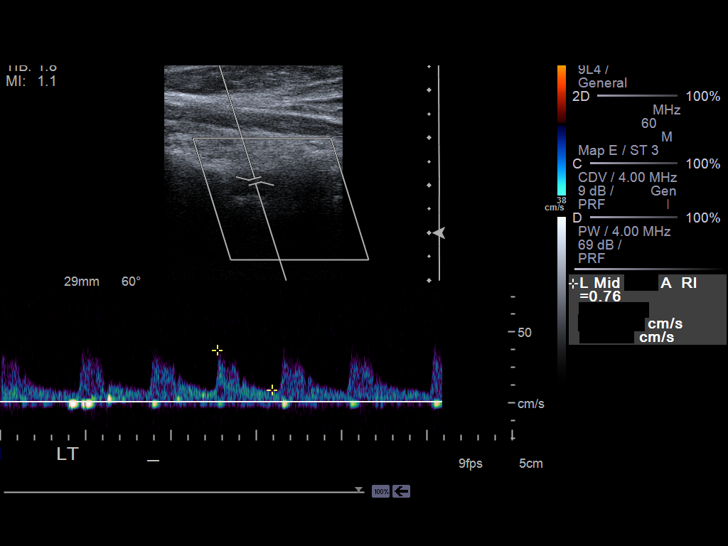

[14 of 24 positions shown; findings below may reference images not displayed]

PROCEDURE:     US  - US CAROTID DOPPLER BILATERAL  - April 30, 2012 [DATE]

RESULT:     Retain carotid Doppler interrogation demonstrates antegrade flow
in both carotids and vertebral systems without flow reversal. There is
minimal carotid plaque in the left common carotid without other significant
plaque visualized. Visually there is no significant stenosis. The peak
systolic velocities are normal. The color Doppler and spectral Doppler
appearance is normal bilaterally. The internal to common carotid peak
systolic velocity ratio is 0.91 on the right and 0.73 on the left.
IMPRESSION: No evidence of hemodynamically significant stenosis.

Dictation Site:2

## 2014-01-03 IMAGING — CR DG CHEST 1V PORT
1 series · 1 of 1 positions shown · non-contrast
Comparison: none

REASON FOR EXAM: CVA
COMMENTS:

[ap]
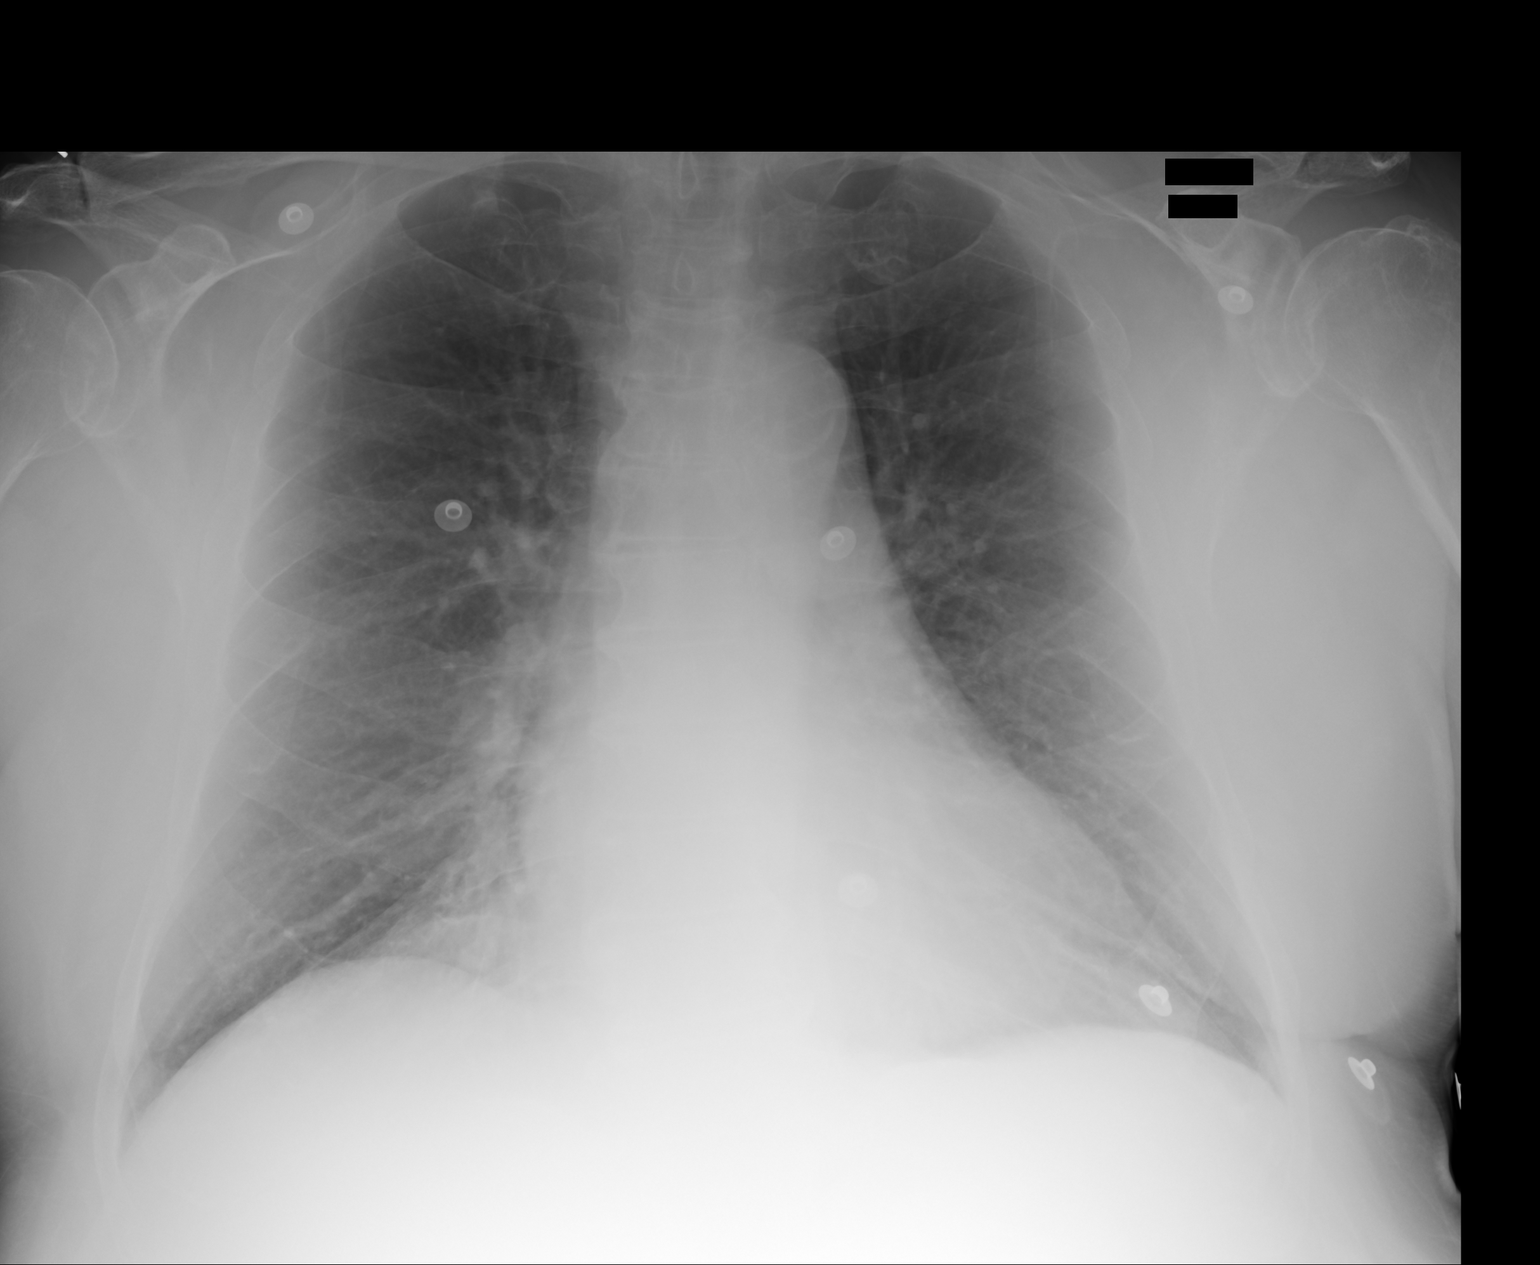

[1 of 1 positions shown; findings below may reference images not displayed]

PROCEDURE:     DXR - DXR PORTABLE CHEST SINGLE VIEW  - April 30, 2012 [DATE]

RESULT:     There is a lordotic projection. The lungs are clear. The heart
and pulmonary vessels are normal. The bony and mediastinal structures are
unremarkable. There is no effusion. There is no pneumothorax or evidence of
congestive failure.
IMPRESSION: No acute cardiopulmonary disease.

[REDACTED]

## 2014-05-09 NOTE — H&P (Signed)
PATIENT NAME:  Larry Williams, Larry Williams MR#:  [id number] DATE OF BIRTH:  1935/04/24  DATE OF ADMISSION:  04/30/2012  CHIEF COMPLAINT: Slurred speech.   REFERRING PHYSICIAN: Lenise Arena.   PRIMARY CARE PHYSICIAN: None. The patient is from [place], has his primary care physician there. He is planning to go back at some point during the next month.   HISTORY OF PRESENT ILLNESS:  The patient is a very nice 79 year old gentleman with a history of uncontrolled diabetes and hypertension. He also has a history of skin cancers removed and hearing loss. He is a retired English as a second language teacher and overall he is here since October of last year living around his daughter. He just escaped from the cold of [place] for the winter; they like to do that every year.   He comes today with a history of going back to bed last night without any problems, being in his normal state of health. This morning he woke up feeling a little bit slurred. His wife states that she did not notice any slurred speech despite the fact that he was saying that he was slurred. He felt like he was not coordinating his words with what he wanted his mouth to do, but overall they noticed full symptom of slurred speech happening whenever they were praying at breakfast around 8:00.   The patient was significantly slurred. Denies any headache, any neck pain, chest pain, or symptoms. He did not have, at any point, any difficulty swallowing, odynophagia, or other problems. He states that he was not weak on any other part of his body and he was able to communicate without problems other than his speech being slurred.  The patient was brought to the Emergency Department. CT scan of the head was done. The CT scan showed no signs of acute abnormalities, but the symptoms persist and they are very concerning for a stroke.   The patient stated he has had right upper extremity weakness for over a month. He works on [employer], and he believes this is carpal tunnel syndrome.    There is no significant weakness on the lower extremities.   REVIEW OF SYSTEMS: 12-systems review of systems was done.  CONSTITUTIONAL: No fever. No fatigue. Positive weakness of the right hand but not the right arm. No significant weight loss. Positive weight gain.  EYES: The patient denies any significant changes in his vision. He has no glaucoma, cataracts or inflammation.  ENT: No tinnitus. No difficulty swallowing. No upper respiratory infection symptoms. The patient states that he has slurred speech, but he is able to swallow.   RESPIRATORY: No cough. No wheezing. No COPD. No pneumonia.  CARDIOVASCULAR: No chest pain, orthopnea, palpitations or edema.  GASTROINTESTINAL: No nausea, vomiting, abdominal pain, jaundice, diarrhea or melena.  GENITOURINARY: No dysuria, hematuria, changes in urinary frequency, no prostatitis.  ENDOCRINOLOGIC: No significant polydipsia, polyphagia. He states that he urinates a lot. His blood sugars off charts, occasionally 300s, 400s. Positive nocturia. Negative cold or heat intolerance. No thyroid problems.  HEMATOLOGIC AND LYMPHATIC: No easy bruising, anemia or bleeding.  SKIN: Without any rashes or petechiae. The patient has had several skin cancers removed for, apparently, basal cell carcinomas.  MUSCULOSKELETAL: No significant neck pain, back pain, shoulder pain. He does have pain and numbness at the level of his right hand, likely related to carpal tunnel syndrome. He has numbness and tingling of the lower extremities, likely related to diabetic neuropathy. No gout.  NEUROLOGIC: As mentioned above, slurred speech. No CVAs or TIAs in the  past. Positive weakness of the right hand but not the right arm; not the right leg. No ataxia. No dementia or memory loss. No seizures.  PSYCHIATRIC: No insomnia, depression or agitation.   PAST MEDICAL HISTORY: 1.  Uncontrolled type 2 insulin-dependent diabetes.  2.  Hypertension.  3.  History of skin cancers/basal cell  carcinoma.  4.  Hearing loss.   ALLERGIES: No known drug allergies.   PAST SURGICAL HISTORY: Cholecystectomy, and surgery on undescended testicle when he was 38.   SOCIAL HISTORY: He has never smoked. He does not drink. He is retired, but he still works on [employer]. He has been for the last 4 or 5 five months visiting his daughter. He lives in [place], and he just lives in [place] for the winter. He has a wife and he lives close to his daughter.   FAMILY HISTORY: Negative CVA. Negative MI. Positive for lung cancer in his father.   MEDICATIONS: Metformin 500 mg twice daily, losartan 50 mg once daily, Lantus 25 units twice daily, aspirin 81 mg once daily, amlodipine 5 mg once daily.   PHYSICAL EXAMINATION: VITAL SIGNS: Blood pressure is 155/75, pulse 66, respiratory 18, temperature 98.8, oxygen saturation 98% on room air.  GENERAL: Alert, oriented x 3. No acute distress. No respiratory distress. Very pleasant. Hemodynamically stable. Cooperative.  HEENT: Pupils are equal, round and reactive. Extraocular movements are intact. Mucosa are moist. Anicteric sclerae. Pink conjunctivae. No oral lesions. No oropharyngeal exudates. Tongue is central. Uvula is central. No thrush. There is a droop of the left side of his mouth and positive slurred speech. His speech is goal-directed, but slurred.  NECK: Supple. No JVD. No thyromegaly. No adenopathy. No carotid bruits. No rigidity.  CARDIOVASCULAR: Regular rate and rhythm. No murmurs, rubs, or gallops. No displacement of PMI. No tenderness to palpation, anterior chest wall.  LUNGS: Are clear, without any wheezing or crepitus. No use of accessory muscles. No dullness to percussion.  ABDOMEN: Soft, nontender, nondistended. No hepatosplenomegaly. No masses. Bowel sounds are positive.  GENITAL EXAM: Deferred.  EXTREMITIES: No edema, no cyanosis, no clubbing. Pulses +2. Capillary refill less than 3.  NEUROLOGIC EXAM: Cranial nerves II-XII are intact. Mostly  findings of nerve VII are okay.  No numbness on the face, but the patient has slurred speech and droop of the left corner of his mouth. All cranial nerves are intact except for VIII.  The patient has significant hearing loss, which is chronic.   Cerebellar testing is intact. Normal rapid alternating movements. Sensation at the level of the lower extremities decreased due to neuropathy. DTRs +2. Strength is 5/5 in 4 extremities. There is some subjective weakness of the right upper extremity, mostly at the level of the hand muscles as the patient could have medial nerve neuropathy.   The patient also has Tinel's test, positive. There is no weakness on the lower extremities.   Balance is normal. No Romberg.   SKIN: Without any rashes or petechiae. No need new lesions.  LYMPHATIC: Negative for lymphadenopathy in neck or supraclavicular areas.  PSYCHIATRIC: Mood is normal without any signs of depression or agitation.  MUSCULOSKELETAL: No significant joint lesions or edema of joints.   LABORATORY DATA: UA negative for signs of infection. INR is 1. White count is 9, hemoglobin is 14.2.   LFTs within normal limits. Glucose 222, creatinine 0.82. Other electrolytes within normal limits.   EKG: Bradycardia; normal sinus syndrome.   CT scan: No signs of acute stroke.   ASSESSMENT AND  PLAN: A 79 year old gentleman with acute stroke/cerebrovascular accident, uncontrolled hypertension and uncontrolled diabetes.   1.  Cerebrovascular accident, slurred speech and droop of the left  side of his mouth. He is having of mild decrease of strenght of the upper and lower extremities and no other signs of weakness. Cranial nerves are as mentioned above, okay.  2.  The patient is admitted for further testing. He had beginning of his symptoms this morning early, but they were more evident at 8:00 a.m. He is now over 4 hours from the window if we count at 8, but apparently the symptoms were much earlier than that, so he  might not benefit from thrombolysis.  3.  The patient has aspirin ordered. We are going to allow permissive hypertension, MRI of the brain ordered, echocardiogram and ultrasound carotids. Neuro checks have been ordered as well, and a speech evaluation. Neurology consult to be obtained as well.  4.  At this moment the patient is stable. We are going to do a PT and OT evaluation as well.  5.  Lipids to be checked. Hemoglobin A1c to be checked.  6.  Diabetes, uncontrolled: Continue Lantus insulin sliding-scale order. Diet appropriate for diabetes, and adjust medications as needed. The patient is not compliant with treatment or diet. 7.  Hypertension: Allow permissive hypertension.  8.  Hearing loss: Is stable.  9.  Other medical problems seem to be stable as well.  10.  THE PATIENT IS A FULL CODE.   Case discussed with family. I spent about 50 minutes with this patient today.    ____________________________ Larry Sink, MD rsg:dm D: 04/30/2012 12:21:32 ET T: 04/30/2012 12:44:29 ET JOB#: [id number]  cc: Cheraw Sink, MD, <Dictator> Aliyha Fornes America Brown MD ELECTRONICALLY SIGNED 05/04/2012 13:35

## 2014-05-09 NOTE — Consult Note (Signed)
Referring Physician:  James Ivanoff, Roselie Awkward :   Primary Care Physician:  James Ivanoff, [facility] : [facility], [address], [place], [place] [address], [place] [phone]  Reason for Consult: Admit Date: 30-Apr-2012  Chief Complaint: slurred speech and difficulty walking  Reason for Consult: CVA   History of Present Illness: History of Present Illness:   79 yo RHD M who woke up this morning and felt like he had slurred speech and difficulty walking.  Pt denies any specific weakness that occured.  He has chronic numbness and tingling in R hand and thinks he might have carpal tunnel.  He also states today that he could not get his words even though he knew what he wanted to say.  He still does not feel back to his baseline yet.  ROS:  General denies complaints   HEENT no complaints   Lungs no complaints   Cardiac no complaints   GI no complaints   GU no complaints   Musculoskeletal no complaints   Extremities no complaints   Skin no complaints   Neuro numbness/tingling   Endocrine no complaints   Psych no complaints   Past Medical/Surgical Hx:  testic:   Cholecystectomy:   Past Medical/ Surgical Hx:  Past Medical History DM and HTN   Past Surgical History denies   Home Medications: Medication Instructions Last Modified Date/Time  aspirin 81 mg oral tablet 1 tab(s) orally once a day 14-Apr-14 10:20  Lantus 100 units/mL subcutaneous solution 50  subcutaneous  14-Apr-14 10:20  metFORMIN 500 mg oral tablet 1 tab(s) orally 2 times a day 14-Apr-14 10:20  amLODIPine 5 mg oral tablet 1 tab(s) orally once a day 14-Apr-14 10:20  losartan 50 mg oral tablet 1 tab(s) orally once a day 14-Apr-14 10:20   Allergies:  No Known Allergies:   Allergies:  Allergies NKDA   Social/Family History: Employment Status: retired  Lives With: alone  Living Arrangements: house; apartment  Social History: no tob, no EtOH, no illicits, 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  Family History: n/c   Vital Signs: **Vital Signs.:   14-Apr-14 12:41  Vital Signs Type Admission  Temperature Temperature (F) 97.6  Celsius 36.4  Temperature Source oral  Pulse Pulse 52  Respirations Respirations 20  Systolic BP Systolic BP 016  Diastolic BP (mmHg) Diastolic BP (mmHg) 76  Mean BP 104  BP Source  if not from Vital Sign Device non-invasive  Pulse Ox % Pulse Ox % 96  Pulse Ox Activity Level  At rest  Oxygen Delivery Room Air/ 21 %   Physical Exam: General: overweight, NAD resting comfortable  HEENT: normocephalic, sclera nonicteric, oropharynx clear  Neck: supple, no JVD, no bruits  Chest: CTA B, no wheezing, good movement  Cardiac: RRR, no murmurs, no edema, 2+ pulses  Extremities: trace edema, no C/C   Neurologic Exam: Mental Status: alert and oriented x 3, normal language, mild dysarthria, follows complex commands  Cranial Nerves: PERRLA, EOMI, nl VF, face symmetric, tongue midline, shoulder shrug equal  Motor Exam: 5/5 B except drift in R UE and R LE, nl tone  Deep Tendon Reflexes: 2+/4 B, plantars downgoing L, Babinski R  Sensory Exam: decreased temp on R hemibody, nl vibration  Coordination: FTN and HTS WNL, nl RAM   Lab Results: Thyroid:  14-Apr-14 09:58   Thyroid Stimulating Hormone 2.76 (0.45-4.50 (International Unit)  ----------------------- Pregnant patients have  different reference  ranges for TSH:  - - - - - - - - - -  Pregnant, first trimetser:  0.36 - 2.50 uIU/mL)  Hepatic:  14-Apr-14 09:58   Bilirubin, Total 0.5  Alkaline Phosphatase 94  SGPT (ALT) 26  SGOT (AST) 21  Total Protein, Serum 7.4  Albumin, Serum 3.5  Routine Chem:  14-Apr-14 09:58   Glucose, Serum  222  BUN 11  Creatinine (comp) 0.82  Sodium, Serum 140  Potassium, Serum 4.1  Chloride, Serum 106  CO2, Serum 28  Calcium (Total), Serum 8.5  Osmolality (calc) 286  eGFR (African American) >60  eGFR (Non-African American) >60 (eGFR values  <40m/min/1.73 m2 may be an indication of chronic kidney disease (CKD). Calculated eGFR is useful in patients with stable renal function. The eGFR calculation will not be reliable in acutely ill patients when serum creatinine is changing rapidly. It is not useful in  patients on dialysis. The eGFR calculation may not be applicable to patients at the low and high extremes of body sizes, pregnant women, and vegetarians.)  Anion Gap  6  Cardiac:  14-Apr-14 09:58   Troponin I < 0.02 (0.00-0.05 0.05 ng/mL or less: NEGATIVE  Repeat testing in 3-6 hrs  if clinically indicated. >0.05 ng/mL: POTENTIAL  MYOCARDIAL INJURY. Repeat  testing in 3-6 hrs if  clinically indicated. NOTE: An increase or decrease  of 30% or more on serial  testing suggests a  clinically important change)  Routine UA:  14-Apr-14 10:10   Color (UA) Yellow  Clarity (UA) Clear  Glucose (UA) 50 mg/dL  Bilirubin (UA) Negative  Ketones (UA) Negative  Specific Gravity (UA) 1.012  Blood (UA) Negative  pH (UA) 6.0  Protein (UA) Negative  Nitrite (UA) Negative  Leukocyte Esterase (UA) Negative (Result(s) reported on 30 Apr 2012 at 11:24AM.)  RBC (UA) 1 /HPF  WBC (UA) <1 /HPF  Bacteria (UA) NONE SEEN  Epithelial Cells (UA) NONE SEEN  Mucous (UA) PRESENT (Result(s) reported on 30 Apr 2012 at 11:24AM.)  Routine Coag:  14-Apr-14 09:58   Prothrombin 13.0  INR 1.0 (INR reference interval applies to patients on anticoagulant therapy. A single INR therapeutic range for coumarins is not optimal for all indications; however, the suggested range for most indications is 2.0 - 3.0. Exceptions to the INR Reference Range may include: Prosthetic heart valves, acute myocardial infarction, prevention of myocardial infarction, and combinations of aspirin and anticoagulant. The need for a higher or lower target INR must be assessed individually. Reference: The Pharmacology and Management of the Vitamin K  antagonists: the seventh  ACCP Conference on Antithrombotic and Thrombolytic Therapy. CNKNLZ.7673Sept:126 (3suppl): [id number] A HCT value >55% may artifactually increase the PT.  In one study,  the increase was an average of 25%. Reference:  "Effect on Routine and Special Coagulation Testing Values of Citrate Anticoagulant Adjustment in Patients with High HCT Values." American Journal of Clinical Pathology 2006;126:400-405.)  Routine Hem:  14-Apr-14 09:58   WBC (CBC) 9.0  RBC (CBC) 4.57  Hemoglobin (CBC) 14.2  Hematocrit (CBC) 42.2  Platelet Count (CBC) 251 (Result(s) reported on 30 Apr 2012 at 10:23AM.)  MCV 92  MCH 31.1  MCHC 33.6  RDW 12.2   Radiology Results: [place]    14-Apr-14 12:16, [place] Doppler Bilateral  [place] Doppler Bilateral   REASON FOR EXAM:    tia/cva  COMMENTS:       PROCEDURE: [place] - [place]OTID DOPPLER BILATERAL  - Apr 30 2012 12:16PM     RESULT: Retain carotid Doppler interrogation demonstrates antegrade flow   in both carotids and vertebral  systems without flow reversal. There is   minimal carotid plaque in the left common carotid without other   significant plaque visualized. Visually there is no significant stenosis.   The peak systolic velocities are normal. The color Doppler and spectral   Doppler appearance is normal bilaterally. The internal to common carotid   peak systolic velocity ratio is 0.91 on the right and 0.73 on the left.    IMPRESSION:  No evidence of hemodynamically significant stenosis.  Dictation Site:2        Verified By: Sundra Aland, M.D., MD  CT:    14-Apr-14 10:07, CT Head Without Contrast  CT Head Without Contrast   REASON FOR EXAM:    CVA  COMMENTS:   May transport without cardiac monitor    PROCEDURE: CT  - CT HEAD WITHOUT CONTRAST  - Apr 30 2012 10:07AM     RESULT: Comparison:  None    Technique: Multiple axial images from the foramen magnum to the vertex   were obtained without IV contrast.    Findings:    There is no  evidence for mass effect, midline shift, or extra-axial fluid   collections. There is no evidence for space-occupying lesion,   intracranial hemorrhage, or cortical-based area of infarction. Mild   periventricular and subcortical hypoattenuation is likely sequela of     chronic microangiopathy. There is a small focus of air in the region of   the straight sinus.    There is mild mucosal thickening of the right maxillary sinus. Mild   mucosal thickening in the ethmoid air cells. There is a trace amount of   fluid in the right masseter cells, which is nonspecific.    The osseous structures are unremarkable.    IMPRESSION:      1. No acute intracranial findings.  2. There is a small focus of air within the region of the straight sinus,   which is of uncertain etiology.  CT can underestimate ischemia in the first 24 hours after the event. If   there is clinical concern for an acute infarct, a followup MRI or repeat   CT scan in 24 hours may provide additional information.        Verified By: Gregor Hams, M.D., MD   Radiology Impression: Radiology Impression: CT of head personally reviewed by me and shows mild atrophy and moderate R>L white matter disease   Impression/Recommendations: Recommendations:   labs reviewed by me and unremarakble notes reviewed by me   L hemispheric syndrome-  symptoms still persists with mild R sided weakness and slurred speech, no other cortical findings now except for his brief aphasia.  This is suspected to be cardioembolic but could be a lacunar as well. Hypertension-  not well controlled Diabetes-  not well controlled continue ASA 31m daily because pt notes poor compliance as outpatient check lipids and add statin for goal LDL < 70 goal BP < 130/80 MRI of brain and echo pending agree with PT/OT/ST consults  will follow  Electronic Signatures: SJamison Neighbor(MD)  (Signed 14-Apr-14 14:14)  Authored: REFERRING PHYSICIAN, Primary Care Physician,  Consult, History of Present Illness, Review of Systems, PAST MEDICAL/SURGICAL HISTORY, HOME MEDICATIONS, ALLERGIES, Social/Family History, NURSING VITAL SIGNS, Physical Exam-, LAB RESULTS, RADIOLOGY RESULTS, Recommendations   Last Updated: 14-Apr-14 14:14 by SJamison Neighbor(MD)

## 2014-05-09 NOTE — Discharge Summary (Signed)
PATIENT NAME:  Larry Williams, Larry Williams MR#:  [id number] DATE OF BIRTH:  10/23/1935  DATE OF ADMISSION:  04/30/2012 DATE OF DISCHARGE: 05/02/2012    DISCHARGE DIAGNOSES:  1. Acute cerebrovascular accident, left pontine. 2. Hyperlipidemia.  3. Uncontrolled type 2 insulin-dependent diabetes.  4. Hypertension.  5. Significant hearing loss.   IMPORTANT RESULTS:  1. CT of the head showed no acute intracranial findings. There is a small focus of air within the straight sinuses.  2. Echocardiogram showed no source of CVA noticed. Challenging imaging quality. Ejection fraction estimated at 55% to 60%. Global left ventricular systolic function is normal. Right ventricular size and systolic function are normal. Normal RVSP.  3. MRI showed acute pontine ischemia, severe atrophy, chronic white matter ischemic changes. 4. Ultrasound Doppler of carotid arteries showed no evidence of hemodynamically significant stenosis.   MEDICATIONS AT DISCHARGE: Include:  1. Losartan 50 mg once a day. 2. Metformin 500 mg twice daily.  3. Acetaminophen 325 mg as needed for pain or fever.  4. Heparin 5000 units every 12 hours.  5. Insulin glargine 30 units subcutaneously twice daily. We increased from his home dose of 25, up to 28, and today I am increasing to 30 as his blood sugar is still not well controlled .  6. Insulin sliding scale.  7. Niacin 500 mg once a day.  8. Atorvastatin 20 mg once daily.  9. Aspirin 325 mg once daily.  10. Amlodipine 5 mg once a day, start in 2 days if systolic blood pressure is still elevated above 150s or 160s.  11. Ranitidine 150 mg every 12 hours.  12. Senna 1 to 2 twice daily for constipation.   DIET: Low-fat diet, cholesterol appropriate and ADA of 1800 kcal, 2 gram sodium diet. No straws, aspiration precautions, pureed diet. Medications crushed in puree. Nectar liquids.   ACTIVITY: As tolerated for his right side weakness.   FOLLOWUP: With PCP. The patient goes to the [place] in [place]. If  he stays here and he does not move back to [place], he will need to have a new PCP. Please arrange that before discharge from rehab.   DISPOSITION: Rehab as [facility].   HOSPITAL COURSE: The patient is a very nice 79 year old gentleman with history of uncontrolled diabetes and hypertension. He is here in [place] visiting his daughter. He comes for the winter from [place], just escaping from the cold, and he has been here since October. He comes and is admitted on the 14th with a history of being in his normal state of health the night before. In the morning, he woke up feeling a little bit slurred. His wife did not notice any changes of slurred speech, but at the time that they were having breakfast at her church, which was around 8:00 a.m., the patient was having some slurred speech. Whenever he was evaluated in the ER, he had good strength of the right upper extremity, right lower extremity, only slurred speech and left-sided facial droop. The patient was not, at that moment, a good candidate for tPA as he was over 4-1/2 hours since the beginning of the symptoms. The patient was admitted for further evaluation.   Cerebrovascular accident. The patient has a left pontine stroke. He has some worsening f his symptoms during this hospitalization after the first 24 hours. I had a long conversation with Dr. Tamala Julian, who was the neurologist who was consulted for his care. He states that he would expect that from pontine strokes. They fluctuate a lot  within the first 48 hours. Due to his stroke, the patient has been put on telemetry. There were no signs of arrhythmias. His echocardiogram did not show any major findings to think this was a cardiogenic thrombolic event. This is likely related to his uncontrolled diabetes and uncontrolled high blood pressure, which he will require good management of these issues for secondary prevention of stroke anyway. The patient had ultrasound of the carotid arteries that  did not show any significant hemodynamic concerns.   The patient has elevated hemoglobin A1c and has elevated blood sugars in the 300s, for which he has been having adjustments of his insulin.   We allowed permissive hypertension, and at this moment, we are restarting his medications, losartan first, then amlodipine. Amlodipine I will recommend starting in the next couple days if his blood pressures are above 150s, 160s.   The patient has normal lipids, but he is started on a statin to help with the reperfusion of the small circulation after his stroke.   Other than that, the patient did well.   OTHER IMPORTANT LABS: Include glucose in the 180s to 200s now. Creatinine of 0.91. His magnesium was 1.6; it was repleted. Hemoglobin A1c 9.4. Triglycerides were 270, for which the patient has been receiving niacin. His HDL was 32, which is low, for which niacin was started as well. His LDL is 53. Hemoglobin was 14, white count 9.1. B12 level was 576.   CONDITION ON DISCHARGE: The patient discharged in good condition to rehab for further evaluation with appropriate diet and appropriate. PT, OT and ST evaluations.   TIME SPENT: I spent about 50 minutes with this discharge.    ____________________________ Edwards Sink, MD rsg:OSi D: 05/02/2012 10:56:36 ET T: 05/02/2012 11:12:33 ET JOB#: [id number]  cc: [place] Sink, MD, <Dictator> Thao Bauza America Brown MD ELECTRONICALLY SIGNED 05/04/2012 13:36
